# Patient Record
Sex: Female | Born: 1975 | Race: Black or African American | Hispanic: No | Smoking: Former smoker
Health system: Southern US, Community
[De-identification: ages and names within clinical notes are randomized; demographics above are authoritative.]

## PROBLEM LIST (undated history)

## (undated) DIAGNOSIS — B019 Varicella without complication: Secondary | ICD-10-CM

## (undated) DIAGNOSIS — T7840XA Allergy, unspecified, initial encounter: Secondary | ICD-10-CM

## (undated) DIAGNOSIS — D689 Coagulation defect, unspecified: Secondary | ICD-10-CM

## (undated) DIAGNOSIS — Z789 Other specified health status: Secondary | ICD-10-CM

## (undated) DIAGNOSIS — K649 Unspecified hemorrhoids: Secondary | ICD-10-CM

## (undated) DIAGNOSIS — K635 Polyp of colon: Secondary | ICD-10-CM

## (undated) HISTORY — DX: Coagulation defect, unspecified: D68.9

## (undated) HISTORY — DX: Unspecified hemorrhoids: K64.9

## (undated) HISTORY — DX: Allergy, unspecified, initial encounter: T78.40XA

## (undated) HISTORY — PX: POLYPECTOMY: SHX149

## (undated) HISTORY — DX: Varicella without complication: B01.9

## (undated) HISTORY — PX: COLONOSCOPY: SHX174

## (undated) HISTORY — DX: Polyp of colon: K63.5

---
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
## 1997-11-27 ENCOUNTER — Emergency Department (HOSPITAL_COMMUNITY): Admission: EM | Admit: 1997-11-27 | Discharge: 1997-11-27 | Payer: Self-pay | Admitting: Emergency Medicine

## 1998-07-23 ENCOUNTER — Inpatient Hospital Stay (HOSPITAL_COMMUNITY): Admission: AD | Admit: 1998-07-23 | Discharge: 1998-07-23 | Payer: Self-pay | Admitting: Obstetrics and Gynecology

## 1998-09-11 ENCOUNTER — Inpatient Hospital Stay (HOSPITAL_COMMUNITY): Admission: AD | Admit: 1998-09-11 | Discharge: 1998-09-13 | Payer: Self-pay | Admitting: Obstetrics and Gynecology

## 1999-03-15 ENCOUNTER — Emergency Department (HOSPITAL_COMMUNITY): Admission: EM | Admit: 1999-03-15 | Discharge: 1999-03-15 | Payer: Self-pay | Admitting: Emergency Medicine

## 1999-03-15 ENCOUNTER — Encounter: Payer: Self-pay | Admitting: Emergency Medicine

## 1999-06-29 ENCOUNTER — Ambulatory Visit (HOSPITAL_COMMUNITY): Admission: RE | Admit: 1999-06-29 | Discharge: 1999-06-29 | Payer: Self-pay | Admitting: *Deleted

## 1999-06-29 ENCOUNTER — Encounter: Payer: Self-pay | Admitting: *Deleted

## 1999-09-20 ENCOUNTER — Inpatient Hospital Stay (HOSPITAL_COMMUNITY): Admission: AD | Admit: 1999-09-20 | Discharge: 1999-09-20 | Payer: Self-pay | Admitting: Obstetrics & Gynecology

## 1999-12-07 ENCOUNTER — Inpatient Hospital Stay (HOSPITAL_COMMUNITY): Admission: AD | Admit: 1999-12-07 | Discharge: 1999-12-09 | Payer: Self-pay | Admitting: Obstetrics and Gynecology

## 2000-04-14 ENCOUNTER — Other Ambulatory Visit: Admission: RE | Admit: 2000-04-14 | Discharge: 2000-04-14 | Payer: Self-pay | Admitting: Obstetrics and Gynecology

## 2001-10-19 ENCOUNTER — Other Ambulatory Visit: Admission: RE | Admit: 2001-10-19 | Discharge: 2001-10-19 | Payer: Self-pay | Admitting: Obstetrics and Gynecology

## 2005-02-06 ENCOUNTER — Other Ambulatory Visit: Admission: RE | Admit: 2005-02-06 | Discharge: 2005-02-06 | Payer: Self-pay | Admitting: Obstetrics and Gynecology

## 2009-04-12 ENCOUNTER — Encounter: Admission: RE | Admit: 2009-04-12 | Discharge: 2009-04-12 | Payer: Self-pay | Admitting: Obstetrics and Gynecology

## 2010-03-06 ENCOUNTER — Inpatient Hospital Stay (INDEPENDENT_AMBULATORY_CARE_PROVIDER_SITE_OTHER)
Admission: RE | Admit: 2010-03-06 | Discharge: 2010-03-06 | Disposition: A | Discharge status: Home / Self Care | Payer: 59 | Source: Ambulatory Visit | Attending: Emergency Medicine | Admitting: Emergency Medicine

## 2010-03-06 DIAGNOSIS — R002 Palpitations: Secondary | ICD-10-CM

## 2010-05-25 NOTE — H&P (Signed)
[facility]  Patient:    Tammy Boyd, Tammy Boyd                      MRN: [id number] Adm. Date:  [id number] Disc. Date: [id number] Attending:  Cleatrice Burke Dictator:   Mack Guise, C.N.M.                         History and Physical  HISTORY OF PRESENT ILLNESS:   Tammy Boyd is a 35 year old gravida 3, para 1-0-1-1 at 39-6/7ths weeks, EDD December 08, 1999, by early ultrasound, who presents with the spontaneous rupture of membranes at home at [facility] for clear fluid.  Contractions began prior to rupture of membranes and have become stronger since that time.  She reports positive fetal movement and no bleeding.  She denies any headache, visual changes or epigastric pain.  Her pregnancy has been followed by the M.D. service at [facility] and remarkable for: 1. A history of BV.  2. No last menstrual period, this is her second pregnancy in less than 12 months.  3. History of PIH.  4. Equivocal rubella. 5. Positive group B strep.  This patient was initially evaluated at the office of [facility] on June 22, 1999, at 16 weeks 6 days by pregnancy ultrasonography. Her pregnancy has been unremarkable.  Throughout the prenatal course the growth of the uterine fundus has been noted to be compatible in size with her dates.  Systolic blood pressure has ranged from 110 to 130, diastolic blood pressure has ranged from 60 to 80.  There has been no significant proteinuria.  PRENATAL LABORATORY WORK:     On June 22, 1999, hemoglobin 12.5, platelets 183,000.  Blood type and Rh A positive.  Antibody screen negative.  VDRL nonreactive.  Rubella equivocal.  Hepatitis B surface antigen negative.  HIV nonreactive.  On June 22, 1999, AFP/free beta-hCG within normal range.  On September 25, 1999, at 28 weeks one-hour glucose challenge 98 and hemoglobin 11.9.  The patient had a history of positive group B strep from her previous pregnancy.  OBSTETRICAL HISTORY:          In 1992 induced AB.   In September 2000, the patient had a normal spontaneous vaginal delivery with the birth of a female infant 6 pounds 3 ounces at term.  History of positive group B strep and PIH with that pregnancy and the present pregnancy.  MEDICAL HISTORY:              The patient has a history of abnormal Pap smear and colposcopy.  FAMILY HISTORY:               Father with a history of heart disease.  The patients mother and father with a history of chronic hypertension.  The patients mother and father with a history of phlebitis and circulatory problems.  Paternal grandmother and maternal grandmother, maternal aunt and a cousin with a history of diabetes.  The patients father with a history of colon CA.  The patients mother with a history of depression.  Maternal aunt with a history of thyroid disease.  Maternal great-grandmother Alzheimers disease.  Maternal aunt rheumatoid arthritis.  HABITS:                       The patient denies the use of alcohol, tobacco or illicit drugs.  GENETIC HISTORY:  There is no family history of genetic or chromosomal disorders, children that died in infancy or that were born with birth defects.  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  Her husband, Beverly Sessions, is involved and supportive. They are of the [facility] [employer] [facility] [employer] faith.  REVIEW OF SYSTEMS:            There are no signs or symptoms suggestive of focal or systemic disease, and the patient is typical of one with a uterine pregnancy at term with premature rupture of membranes in early labor.  OBJECTIVE DATA:  VITAL SIGNS:                  Stable, afebrile.  HEENT:                        Unremarkable.  HEART:                        Regular rate and rhythm.  LUNGS:                        Clear.  ABDOMEN:                      Soft and nontender, gravid in its contour. Uterine fundus is noted to extend 40 cm above the level of the  pubic symphysis.  Leopolds maneuver finds the infant to be in a longitudinal lie, cephalic presentation and the estimated fetal weight is 7.5 pounds.  PELVIC:                       Digital exam of the cervix finds it to be 3-4 cm dilated, 80% effaced with the cephalic presenting part at a -1 station. Membranes are grossly ruptured and clear.  ASSESSMENT:                   1. Intrauterine pregnancy at term with premature                                  rupture of membranes.                               2. In early labor.  PLAN:                         Admit to birthing suite per Dr. Dierdre Forth. Routine M.D. orders.  Start group B strep prophylaxis with penicillin G. DD:  12/07/99 TD:  12/07/99 Job: [id number]

## 2011-04-06 IMAGING — MG MM DIGITAL SCREENING BILAT W/ CAD
4 series · 4 of 4 positions shown · non-contrast
Comparison: none

DG SCREEN MAMMOGRAM BILATERAL
Bilateral CC and MLO view(s) were taken.

DIGITAL SCREENING MAMMOGRAM WITH CAD:
The breast tissue is extremely dense.  No masses or malignant type calcifications are identified.
Images were processed with CAD.

[R CC]
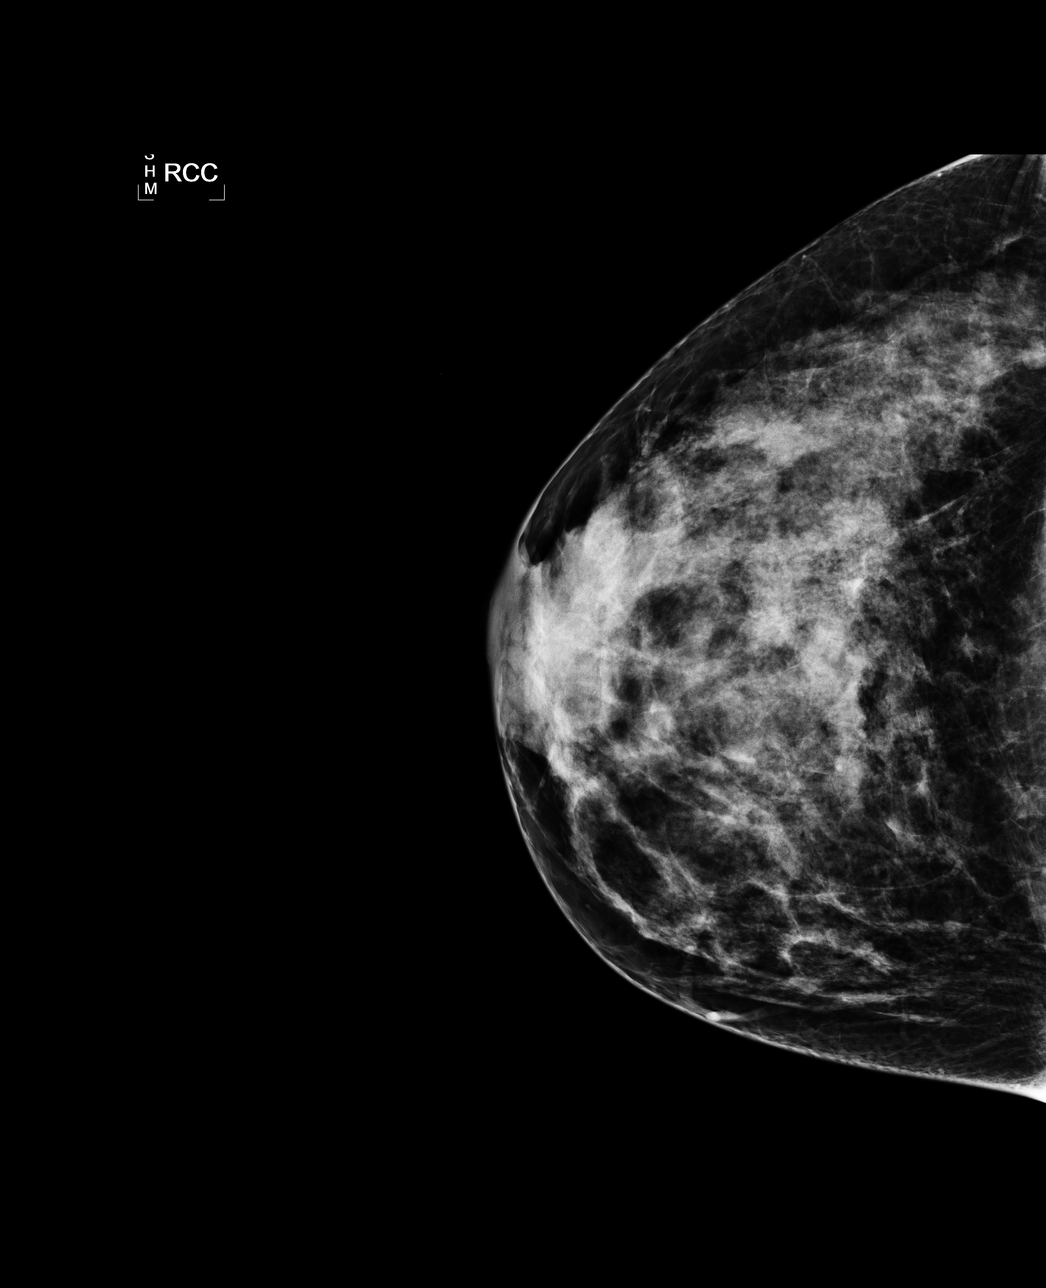

[L CC]
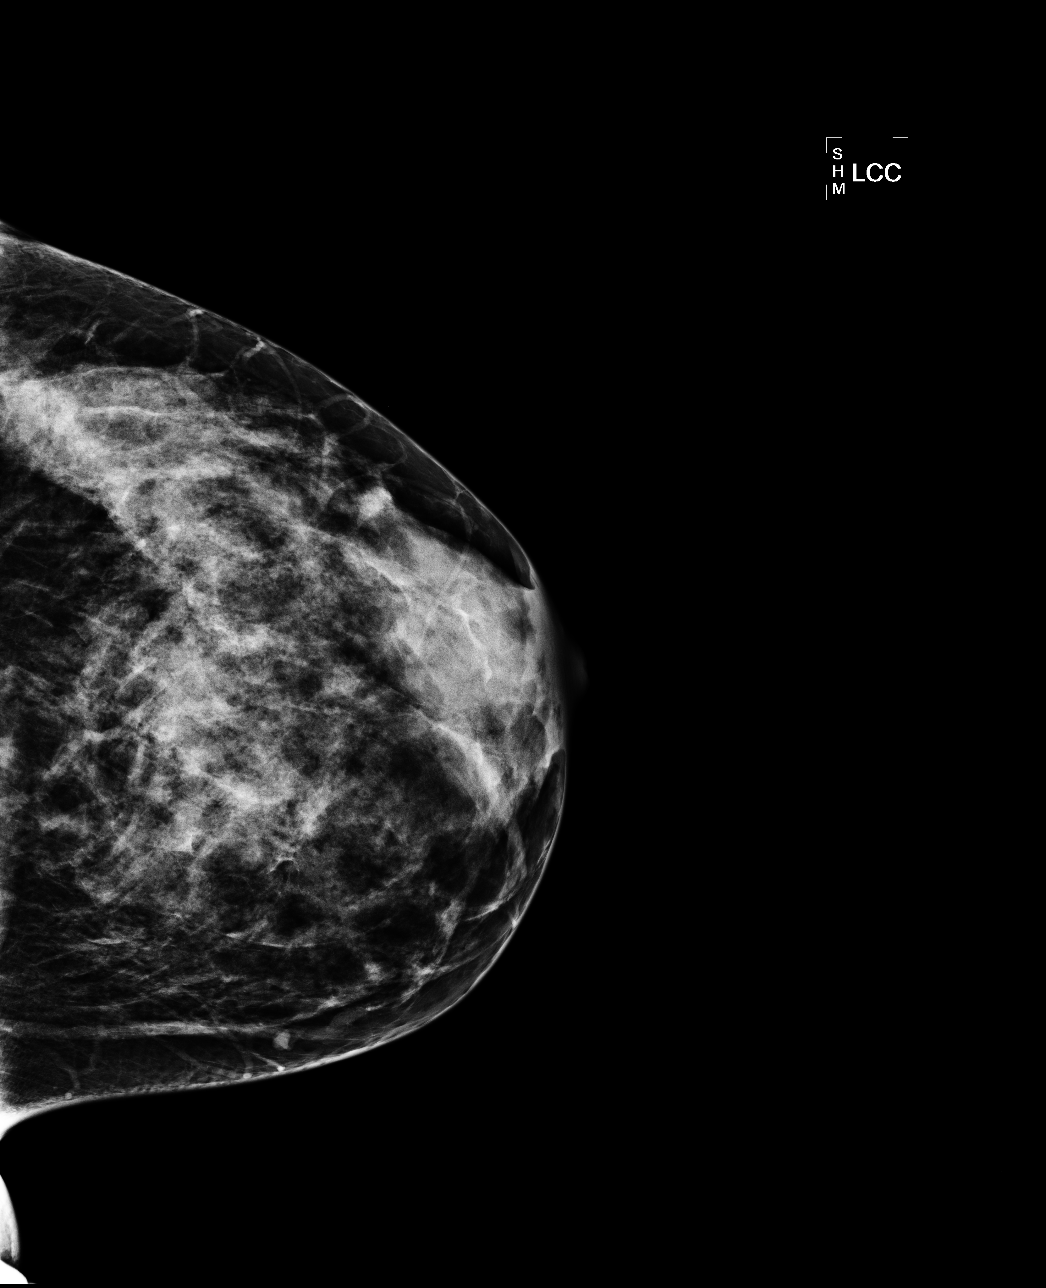

[L MLO]
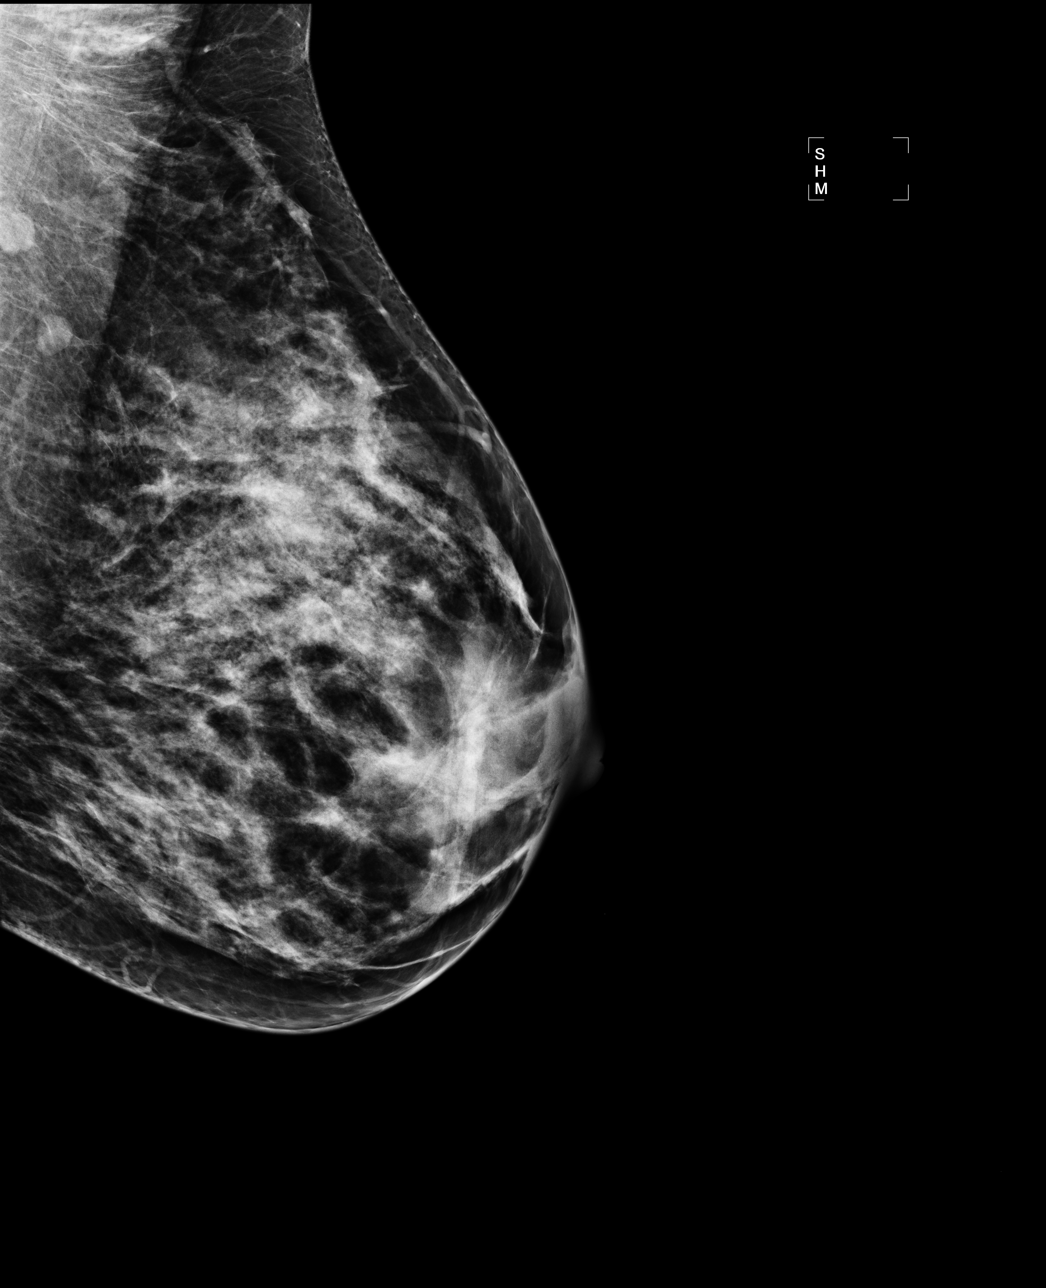

[R MLO]
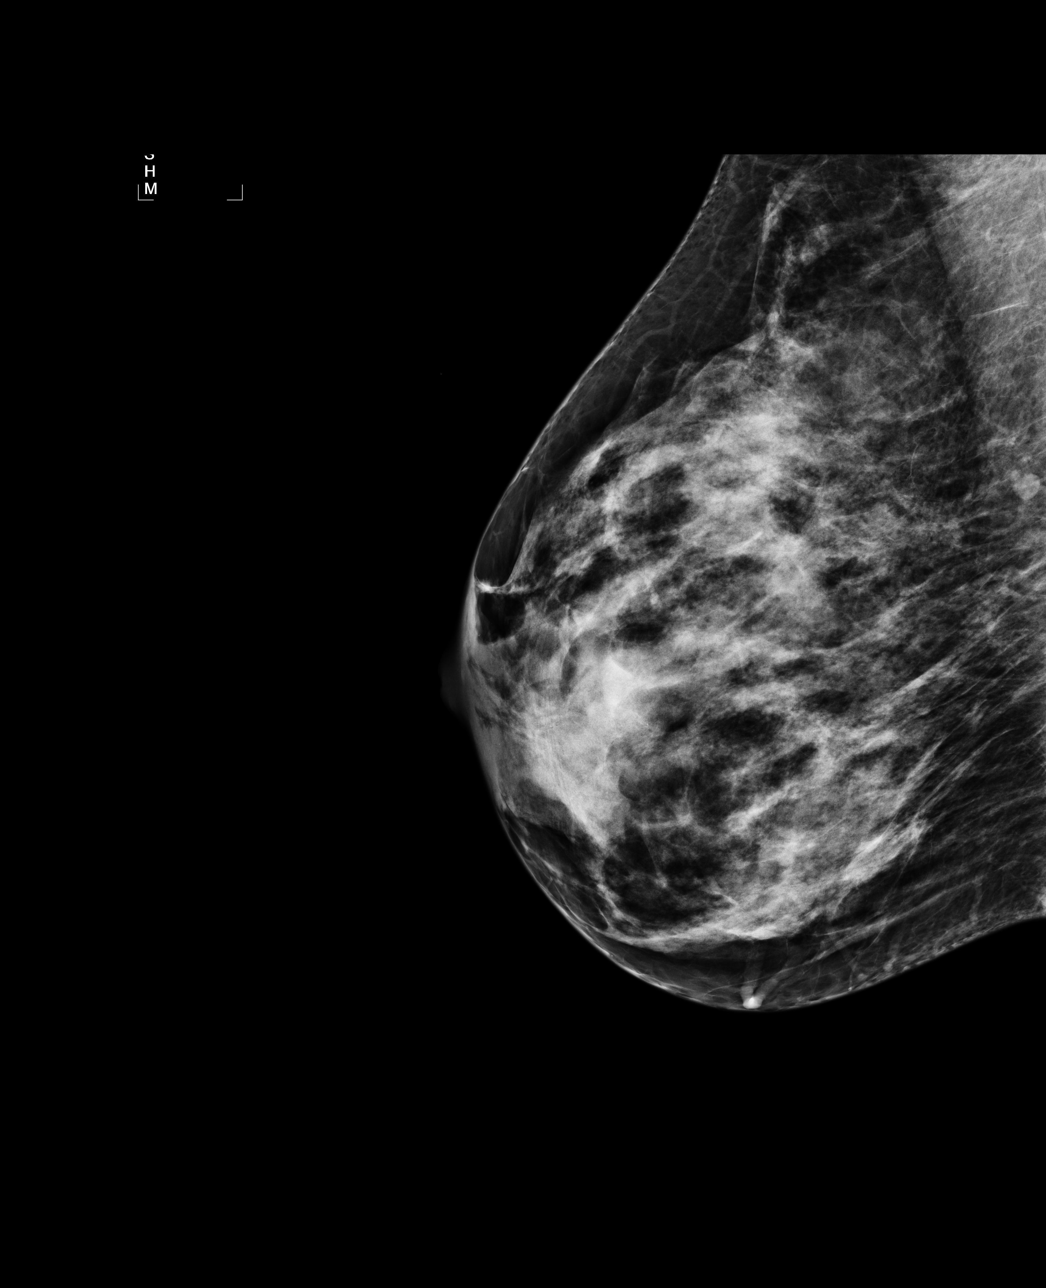

[4 of 4 positions shown; findings below may reference images not displayed]

IMPRESSION: No specific mammographic evidence of malignancy.  Next screening mammogram is recommended at age 
39.

A result letter of this screening mammogram will be mailed directly to the patient.

ASSESSMENT: Negative - BI-RADS 1

Screening mammogram at age 39.
,

## 2011-11-13 ENCOUNTER — Encounter: Payer: Self-pay | Admitting: Internal Medicine

## 2011-11-29 ENCOUNTER — Ambulatory Visit (INDEPENDENT_AMBULATORY_CARE_PROVIDER_SITE_OTHER): Payer: 59 | Admitting: Internal Medicine

## 2011-11-29 ENCOUNTER — Encounter: Payer: Self-pay | Admitting: Internal Medicine

## 2011-11-29 VITALS — BP 110/72 | HR 89 | Ht 68.0 in | Wt 194.2 lb

## 2011-11-29 DIAGNOSIS — Z800 Family history of malignant neoplasm of digestive organs: Secondary | ICD-10-CM

## 2011-11-29 DIAGNOSIS — R198 Other specified symptoms and signs involving the digestive system and abdomen: Secondary | ICD-10-CM

## 2011-11-29 DIAGNOSIS — Z1211 Encounter for screening for malignant neoplasm of colon: Secondary | ICD-10-CM

## 2011-11-29 DIAGNOSIS — K625 Hemorrhage of anus and rectum: Secondary | ICD-10-CM

## 2011-11-29 MED ORDER — MOVIPREP 100 G PO SOLR: 1.0000 | Freq: Once | ORAL | Status: DC

## 2011-11-29 NOTE — Progress Notes (Signed)
HISTORY OF PRESENT ILLNESS:  Tammy Boyd is a 36 y.o. female with no significant past medical history who presents today regarding minor rectal bleeding, rectal discomfort, change in bowel habits, and the need for screening colonoscopy. The patient's father was diagnosed with colon cancer at age 65. She also has a maternal great uncle and great aunt with colon cancer. The patient herself was well until 3-4 months ago when she began to notice minor red blood on the tissue paper and occasionally in the toilet bowl. Associated with this was some rectal discomfort. Burning or stinging. This has improved over the past 3 weeks. She denies abdominal pain or weight loss. Her bowels have been somewhat less firm. Otherwise no complaints.  REVIEW OF SYSTEMS:  All non-GI ROS negative except for back pain  History reviewed. No pertinent past medical history.  Past Surgical History  Procedure Date  . Induced abortion     Social History Tammy Boyd  reports that she has been smoking Cigarettes.  She has never used smokeless tobacco. She reports that she drinks alcohol. She reports that she does not use illicit drugs.  family history includes Breast cancer in her maternal grandmother; Colon cancer in her father; Colon polyps in her mother; Diabetes in her mother; and Ulcerative colitis in her mother.  No Known Allergies     PHYSICAL EXAMINATION: Vital signs: BP 110/72  Pulse 89  Ht 5\' 8"  (1.727 m)  Wt 194 lb 3.2 oz (88.089 kg)  BMI 29.53 kg/m2  SpO2 99%  LMP 11/14/2011  Constitutional: generally well-appearing, no acute distress Psychiatric: alert and oriented x3, cooperative Eyes: extraocular movements intact, anicteric, conjunctiva pink Mouth: oral pharynx moist, no lesions Neck: supple no lymphadenopathy Cardiovascular: heart regular rate and rhythm, no murmur Lungs: clear to auscultation bilaterally Abdomen: soft, nontender, nondistended, no obvious ascites, no peritoneal  signs, normal bowel sounds, no organomegaly Rectal:deferred until colonoscopy Extremities: no lower extremity edema bilaterally Skin: no lesions on visible extremities Neuro: No focal deficits.   ASSESSMENT:  #1. Family history of colon cancer in her father at age 40. The patient is 1, and is appropriate for her to have her initial screening colonoscopy at this time. #2. Minor intermittent rectal bleeding with rectal discomfort and change in bowel habits as described. Suspect she had a rectal fissure, which has healed  PLAN:  #1. Screening colonoscopy.The nature of the procedure, as well as the risks, benefits, and alternatives were carefully and thoroughly reviewed with the patient. Ample time for discussion and questions allowed. The patient understood, was satisfied, and agreed to proceed. Movi prep prescribed. The patient instructed on its use #2. Daily fiber supplementation with Metamucil

## 2011-11-29 NOTE — Patient Instructions (Addendum)
You have been scheduled for a colonoscopy with propofol. Please follow written instructions given to you at your visit today.  Please pick up your prep kit at the pharmacy within the next 1-3 days. If you use inhalers (even only as needed) or a CPAP machine, please bring them with you on the day of your procedure.  

## 2011-12-02 ENCOUNTER — Encounter: Payer: Self-pay | Admitting: Internal Medicine

## 2011-12-11 ENCOUNTER — Encounter: Payer: 59 | Admitting: Internal Medicine

## 2012-01-16 ENCOUNTER — Ambulatory Visit (AMBULATORY_SURGERY_CENTER): Payer: 59 | Admitting: Internal Medicine

## 2012-01-16 ENCOUNTER — Encounter: Payer: Self-pay | Admitting: Internal Medicine

## 2012-01-16 VITALS — BP 153/92 | HR 60 | Temp 97.6°F | Resp 15 | Ht 68.0 in | Wt 194.0 lb

## 2012-01-16 DIAGNOSIS — D126 Benign neoplasm of colon, unspecified: Secondary | ICD-10-CM

## 2012-01-16 DIAGNOSIS — K625 Hemorrhage of anus and rectum: Secondary | ICD-10-CM

## 2012-01-16 DIAGNOSIS — R198 Other specified symptoms and signs involving the digestive system and abdomen: Secondary | ICD-10-CM

## 2012-01-16 DIAGNOSIS — Z1211 Encounter for screening for malignant neoplasm of colon: Secondary | ICD-10-CM

## 2012-01-16 DIAGNOSIS — Z800 Family history of malignant neoplasm of digestive organs: Secondary | ICD-10-CM

## 2012-01-16 MED ORDER — SODIUM CHLORIDE 0.9 % IV SOLN: 500.0000 mL | INTRAVENOUS | Status: DC

## 2012-01-16 NOTE — Patient Instructions (Addendum)
YOU HAD AN ENDOSCOPIC PROCEDURE TODAY AT THE [facility]: Refer to the procedure report that was given to you for any specific questions about what was found during the examination.  If the procedure report does not answer your questions, please call your gastroenterologist to clarify.  If you requested that your care partner not be given the details of your procedure findings, then the procedure report has been included in a sealed envelope for you to review at your convenience later.  YOU SHOULD EXPECT: Some feelings of bloating in the abdomen. Passage of more gas than usual.  Walking can help get rid of the air that was put into your GI tract during the procedure and reduce the bloating. If you had a lower endoscopy (such as a colonoscopy or flexible sigmoidoscopy) you may notice spotting of blood in your stool or on the toilet paper. If you underwent a bowel prep for your procedure, then you may not have a normal bowel movement for a few days.  DIET: Your first meal following the procedure should be a light meal and then it is ok to progress to your normal diet.  A half-sandwich or bowl of soup is an example of a good first meal.  Heavy or fried foods are harder to digest and may make you feel nauseous or bloated.  Likewise meals heavy in dairy and vegetables can cause extra gas to form and this can also increase the bloating.  Drink plenty of fluids but you should avoid alcoholic beverages for 24 hours.  ACTIVITY: Your care partner should take you home directly after the procedure.  You should plan to take it easy, moving slowly for the rest of the day.  You can resume normal activity the day after the procedure however you should NOT DRIVE or use heavy machinery for 24 hours (because of the sedation medicines used during the test).    SYMPTOMS TO REPORT IMMEDIATELY: A gastroenterologist can be reached at any hour.  During normal business hours, 8:30 AM to 5:00 PM Monday through Friday,  call [phone].  After hours and on weekends, please call the GI answering service at [phone] who will take a message and have the physician on call contact you.   Following lower endoscopy (colonoscopy or flexible sigmoidoscopy):  Excessive amounts of blood in the stool  Significant tenderness or worsening of abdominal pains  Swelling of the abdomen that is new, acute  Fever of 100F or higher  FOLLOW UP: If any biopsies were taken you will be contacted by phone or by letter within the next 1-3 weeks.  Call your gastroenterologist if you have not heard about the biopsies in 3 weeks.  Our staff will call the home number listed on your records the next business day following your procedure to check on you and address any questions or concerns that you may have at that time regarding the information given to you following your procedure. This is a courtesy call and so if there is no answer at the home number and we have not heard from you through the emergency physician on call, we will assume that you have returned to your regular daily activities without incident.  SIGNATURES/CONFIDENTIALITY: You and/or your care partner have signed paperwork which will be entered into your electronic medical record.  These signatures attest to the fact that that the information above on your After Visit Summary has been reviewed and is understood.  Full responsibility of the confidentiality of this   discharge information lies with you and/or your care-partner.    Resume medications. Information given on polyps with discharge instructions. 

## 2012-01-16 NOTE — Op Note (Signed)
[facility] [address] [place], [address]   COLONOSCOPY PROCEDURE REPORT  PATIENT: Tammy Boyd, Tammy Boyd  MR#: [id number] BIRTHDATE: January 29, 1975 , 36  yrs. old GENDER: Female ENDOSCOPIST: Roxy Cedar, MD REFERRED XB:JYNWG Richardson, M.D. PROCEDURE DATE:  01/16/2012 PROCEDURE:   Colonoscopy with snare polypectomy    x 1 ASA CLASS:   Class I INDICATIONS:elevated risk screening.   Father with CRC at 86 MEDICATIONS: MAC sedation, administered by CRNA and propofol (Diprivan) 300mg  IV  DESCRIPTION OF PROCEDURE:   After the risks benefits and alternatives of the procedure were thoroughly explained, informed consent was obtained.  A digital rectal exam revealed no abnormalities of the rectum.   The LB CF-[id number]  endoscope was introduced through the anus and advanced to the cecum, which was identified by both the appendix and ileocecal valve. No adverse events experienced.   The quality of the prep was excellent, using MoviPrep  The instrument was then slowly withdrawn as the colon was fully examined.      COLON FINDINGS: A diminutive polyp was found in the ascending colon. A polypectomy was performed with a cold snare.  The resection was complete and the polyp tissue was completely retrieved.   The colon mucosa was otherwise normal.  Retroflexed views revealed internal hemorrhoids. The time to cecum=2 minutes 07 seconds.  Withdrawal time=11 minutes 07 seconds.  The scope was withdrawn and the procedure completed. COMPLICATIONS: There were no complications.  ENDOSCOPIC IMPRESSION: 1.   Diminutive polyp was found in the ascending colon; polypectomy was performed with a cold snare 2.   The colon mucosa was otherwise normal  RECOMMENDATIONS: 1. Follow up colonoscopy in 5 years   eSigned:  Roxy Cedar, MD 01/16/2012 1:17 PM   cc: Huel Cote, MD and The Patient

## 2012-01-16 NOTE — Progress Notes (Signed)
Patient did not experience any of the following events: a burn prior to discharge; a fall within the facility; wrong site/side/patient/procedure/implant event; or a hospital transfer or hospital admission upon discharge from the facility. (G8907) Patient did not have preoperative order for IV antibiotic SSI prophylaxis. (G8918)  

## 2012-01-16 NOTE — Progress Notes (Signed)
1320 a/ox3 pleased with MAC report to [employer]

## 2012-01-16 NOTE — Progress Notes (Signed)
Called to room to assist during endoscopic procedure.  Patient ID and intended procedure confirmed with present staff. Received instructions for my participation in the procedure from the performing physician.  

## 2012-01-17 ENCOUNTER — Telehealth: Payer: Self-pay | Admitting: *Deleted

## 2012-01-17 NOTE — Telephone Encounter (Signed)
  Follow up Call-  Call back number 01/16/2012  Post procedure Call Back phone  # [phone]  Permission to leave phone message Yes     Patient questions:  Do you have a fever, pain , or abdominal swelling? no Pain Score  0 *  Have you tolerated food without any problems? yes  Have you been able to return to your normal activities? yes  Do you have any questions about your discharge instructions: Diet   no Medications  no Follow up visit  no  Do you have questions or concerns about your Care? no  Actions: * If pain score is 4 or above: No action needed, pain <4.

## 2012-01-21 ENCOUNTER — Encounter: Payer: Self-pay | Admitting: Internal Medicine

## 2013-11-04 ENCOUNTER — Encounter (HOSPITAL_COMMUNITY): Payer: Self-pay | Admitting: Pharmacist

## 2013-11-09 ENCOUNTER — Encounter (HOSPITAL_COMMUNITY)
Admission: RE | Admit: 2013-11-09 | Discharge: 2013-11-09 | Disposition: A | Discharge status: Home / Self Care | Payer: 59 | Source: Ambulatory Visit | Attending: Obstetrics and Gynecology | Admitting: Obstetrics and Gynecology

## 2013-11-09 ENCOUNTER — Encounter (HOSPITAL_COMMUNITY): Payer: Self-pay

## 2013-11-09 DIAGNOSIS — Z01812 Encounter for preprocedural laboratory examination: Secondary | ICD-10-CM | POA: Insufficient documentation

## 2013-11-09 HISTORY — DX: Other specified health status: Z78.9

## 2013-11-09 LAB — CBC
HCT: 37.1 % (ref 36.0–46.0)
HEMOGLOBIN: 12.5 g/dL (ref 12.0–15.0)
MCH: 30.2 pg (ref 26.0–34.0)
MCHC: 33.7 g/dL (ref 30.0–36.0)
MCV: 89.6 fL (ref 78.0–100.0)
Platelets: 234 10*3/uL (ref 150–400)
RBC: 4.14 MIL/uL (ref 3.87–5.11)
RDW: 13.8 % (ref 11.5–15.5)
WBC: 6.6 10*3/uL (ref 4.0–10.5)

## 2013-11-09 NOTE — Patient Instructions (Signed)
Your procedure is scheduled on: 11/15/13  Enter through the Main Entrance at : 6am Pick up desk phone and dial [phone] and inform us of your arrival.  Please call [phone] if you have any problems the morning of surgery.  Remember: Do not eat food or drink liquids, including water, after midnight:Sunday   You may brush your teeth the morning of surgery.   DO NOT wear jewelry, eye make-up, lipstick,body lotion, or dark fingernail polish.  (Polished toes are ok) You may wear deodorant.  If you are to be admitted after surgery, leave suitcase in car until your room has been assigned. Patients discharged on the day of surgery will not be allowed to drive home. Wear loose fitting, comfortable clothes for your ride home.

## 2013-11-14 NOTE — H&P (Signed)
Tammy Boyd is an 38 y.o. female G2P2 who presents for scheduled laparoscopic BTL.  She had an attempted Essure x 2 in the office with successful placement of her left coil, but the right coil would never feed into the tube.  After two attempts, we felt the best option was to proceed to laparoscopic BTL.  She has been on Depo-provera for contraception.  Pertinent Gynecological History: OB History:  NSVD x 2   Menstrual History:  No LMP recorded.    Past Medical History  Diagnosis Date  . Medical history non-contributory     Past Surgical History  Procedure Laterality Date  . Induced abortion      Family History  Problem Relation Age of Onset  . Breast cancer Maternal Grandmother     maternal great aunt  . Colon cancer Father     in remission  . Colon polyps Mother   . Ulcerative colitis Mother   . Diabetes Mother     father, maternal aunts    Social History:  reports that she quit smoking about 1 years ago. Her smoking use included Cigarettes. She smoked 0.00 packs per day. She has never used smokeless tobacco. She reports that she drinks alcohol. She reports that she does not use illicit drugs.  Allergies: No Known Allergies  No prescriptions prior to admission    ROS  There were no vitals taken for this visit. Physical Exam  Constitutional: She is oriented to person, place, and time. She appears well-nourished.  Cardiovascular: Normal rate and regular rhythm.   Respiratory: Effort normal.  GI: Soft.  Genitourinary: Vagina normal and uterus normal.  Neurological: She is alert and oriented to person, place, and time.  Psychiatric: She has a normal mood and affect.    No results found for this or any previous visit (from the past 24 hour(s)).  No results found.  Assessment/Plan: Pt was counseled on the risks and benefits of laparoscopic tubal ligation in detail. We discussed bleeding, infection and possible damage to bowel and bladder with trocar  placement.  We discussed  A risk of failure of 1/100 and increased risk of ectopic if pregnancy occurs.  Pt desires to proceed.  Logan Bores 11/14/2013, 10:21 AM

## 2013-11-14 NOTE — Anesthesia Preprocedure Evaluation (Addendum)
Anesthesia Evaluation  Patient identified by MRN, date of birth, ID band Patient awake    Reviewed: Allergy & Precautions, H&P , NPO status , Patient's Chart, lab work & pertinent test results  Airway Mallampati: II  TM Distance: >3 FB Neck ROM: Full    Dental no notable dental hx. (+) Teeth Intact   Pulmonary neg pulmonary ROS, former smoker,  breath sounds clear to auscultation  Pulmonary exam normal       Cardiovascular negative cardio ROS  Rhythm:Regular Rate:Normal     Neuro/Psych negative neurological ROS  negative psych ROS   GI/Hepatic negative GI ROS, Neg liver ROS,   Endo/Other  Obesity  Renal/GU negative Renal ROS  negative genitourinary   Musculoskeletal negative musculoskeletal ROS (+)   Abdominal (+) + obese,   Peds  Hematology negative hematology ROS (+)   Anesthesia Other Findings   Reproductive/Obstetrics Desires Sterilization                            Anesthesia Physical Anesthesia Plan  ASA: II  Anesthesia Plan: General   Post-op Pain Management:    Induction: Intravenous  Airway Management Planned: Oral ETT  Additional Equipment:   Intra-op Plan:   Post-operative Plan: Extubation in OR  Informed Consent: I have reviewed the patients History and Physical, chart, labs and discussed the procedure including the risks, benefits and alternatives for the proposed anesthesia with the patient or authorized representative who has indicated his/her understanding and acceptance.   Dental advisory given  Plan Discussed with: Anesthesiologist, CRNA and Surgeon  Anesthesia Plan Comments:         Anesthesia Quick Evaluation

## 2013-11-15 ENCOUNTER — Ambulatory Visit (HOSPITAL_COMMUNITY)
Admission: RE | Admit: 2013-11-15 | Discharge: 2013-11-15 | Disposition: A | Discharge status: Home / Self Care | Payer: 59 | Source: Ambulatory Visit | Attending: Obstetrics and Gynecology | Admitting: Obstetrics and Gynecology

## 2013-11-15 ENCOUNTER — Encounter (HOSPITAL_COMMUNITY): Payer: Self-pay | Admitting: Anesthesiology

## 2013-11-15 ENCOUNTER — Ambulatory Visit (HOSPITAL_COMMUNITY): Payer: 59 | Admitting: Anesthesiology

## 2013-11-15 ENCOUNTER — Encounter (HOSPITAL_COMMUNITY)
Admission: RE | Disposition: A | Discharge status: Home / Self Care | Payer: Self-pay | Source: Ambulatory Visit | Attending: Obstetrics and Gynecology

## 2013-11-15 DIAGNOSIS — Z6834 Body mass index (BMI) 34.0-34.9, adult: Secondary | ICD-10-CM | POA: Diagnosis not present

## 2013-11-15 DIAGNOSIS — E669 Obesity, unspecified: Secondary | ICD-10-CM | POA: Insufficient documentation

## 2013-11-15 DIAGNOSIS — Z302 Encounter for sterilization: Secondary | ICD-10-CM | POA: Diagnosis present

## 2013-11-15 DIAGNOSIS — Z87891 Personal history of nicotine dependence: Secondary | ICD-10-CM | POA: Insufficient documentation

## 2013-11-15 HISTORY — PX: LAPAROSCOPIC TUBAL LIGATION: SHX1937

## 2013-11-15 LAB — PREGNANCY, URINE: Preg Test, Ur: NEGATIVE

## 2013-11-15 SURGERY — LIGATION, FALLOPIAN TUBE, LAPAROSCOPIC
Anesthesia: General | Laterality: Bilateral

## 2013-11-15 MED ORDER — MORPHINE SULFATE 4 MG/ML IJ SOLN: 1.0000 mg | INTRAMUSCULAR | Status: DC | PRN

## 2013-11-15 MED ORDER — 0.9 % SODIUM CHLORIDE (POUR BTL) OPTIME
TOPICAL | Status: DC | PRN
  Administered 2013-11-15: 1000 mL

## 2013-11-15 MED ORDER — OXYCODONE-ACETAMINOPHEN 5-325 MG PO TABS: 1.0000 | ORAL_TABLET | ORAL | Status: DC | PRN

## 2013-11-15 MED ORDER — NEOSTIGMINE METHYLSULFATE 10 MG/10ML IV SOLN
INTRAVENOUS | Status: AC
  Filled 2013-11-15: qty 1

## 2013-11-15 MED ORDER — METOCLOPRAMIDE HCL 5 MG/ML IJ SOLN: 10.0000 mg | Freq: Once | INTRAMUSCULAR | Status: DC | PRN

## 2013-11-15 MED ORDER — LIDOCAINE HCL (CARDIAC) 20 MG/ML IV SOLN
INTRAVENOUS | Status: AC
  Filled 2013-11-15: qty 5

## 2013-11-15 MED ORDER — ROCURONIUM BROMIDE 100 MG/10ML IV SOLN
INTRAVENOUS | Status: AC
  Filled 2013-11-15: qty 1

## 2013-11-15 MED ORDER — FENTANYL CITRATE 0.05 MG/ML IJ SOLN
INTRAMUSCULAR | Status: AC
  Filled 2013-11-15: qty 5

## 2013-11-15 MED ORDER — MIDAZOLAM HCL 2 MG/2ML IJ SOLN
INTRAMUSCULAR | Status: AC
  Filled 2013-11-15: qty 2

## 2013-11-15 MED ORDER — NEOSTIGMINE METHYLSULFATE 10 MG/10ML IV SOLN
INTRAVENOUS | Status: DC | PRN
  Administered 2013-11-15: 3 mg via INTRAVENOUS

## 2013-11-15 MED ORDER — FENTANYL CITRATE 0.05 MG/ML IJ SOLN
INTRAMUSCULAR | Status: DC | PRN
  Administered 2013-11-15 (×5): 50 ug via INTRAVENOUS

## 2013-11-15 MED ORDER — ONDANSETRON HCL 4 MG/2ML IJ SOLN
INTRAMUSCULAR | Status: DC | PRN
  Administered 2013-11-15: 4 mg via INTRAVENOUS

## 2013-11-15 MED ORDER — ONDANSETRON HCL 4 MG/2ML IJ SOLN
INTRAMUSCULAR | Status: AC
  Filled 2013-11-15: qty 2

## 2013-11-15 MED ORDER — PROPOFOL 10 MG/ML IV BOLUS
INTRAVENOUS | Status: DC | PRN
  Administered 2013-11-15: 200 mg via INTRAVENOUS

## 2013-11-15 MED ORDER — MIDAZOLAM HCL 2 MG/2ML IJ SOLN
INTRAMUSCULAR | Status: DC | PRN
  Administered 2013-11-15: 2 mg via INTRAVENOUS

## 2013-11-15 MED ORDER — BUPIVACAINE HCL (PF) 0.25 % IJ SOLN
INTRAMUSCULAR | Status: AC
  Filled 2013-11-15: qty 30

## 2013-11-15 MED ORDER — SCOPOLAMINE 1 MG/3DAYS TD PT72
1.0000 | MEDICATED_PATCH | Freq: Once | TRANSDERMAL | Status: DC
  Administered 2013-11-15: 1.5 mg via TRANSDERMAL

## 2013-11-15 MED ORDER — GLYCOPYRROLATE 0.2 MG/ML IJ SOLN
INTRAMUSCULAR | Status: AC
  Filled 2013-11-15: qty 3

## 2013-11-15 MED ORDER — GLYCOPYRROLATE 0.2 MG/ML IJ SOLN
INTRAMUSCULAR | Status: DC | PRN
  Administered 2013-11-15: 0.6 mg via INTRAVENOUS

## 2013-11-15 MED ORDER — BUPIVACAINE HCL (PF) 0.25 % IJ SOLN
INTRAMUSCULAR | Status: DC | PRN
  Administered 2013-11-15: 10 mL

## 2013-11-15 MED ORDER — KETOROLAC TROMETHAMINE 30 MG/ML IJ SOLN
INTRAMUSCULAR | Status: AC
  Filled 2013-11-15: qty 1

## 2013-11-15 MED ORDER — DEXAMETHASONE SODIUM PHOSPHATE 10 MG/ML IJ SOLN
INTRAMUSCULAR | Status: AC
  Filled 2013-11-15: qty 1

## 2013-11-15 MED ORDER — DEXAMETHASONE SODIUM PHOSPHATE 10 MG/ML IJ SOLN
INTRAMUSCULAR | Status: DC | PRN
  Administered 2013-11-15: 10 mg via INTRAVENOUS

## 2013-11-15 MED ORDER — LACTATED RINGERS IV SOLN
INTRAVENOUS | Status: DC
  Administered 2013-11-15 (×2): via INTRAVENOUS

## 2013-11-15 MED ORDER — SCOPOLAMINE 1 MG/3DAYS TD PT72: 1.0000 | MEDICATED_PATCH | TRANSDERMAL | Status: DC

## 2013-11-15 MED ORDER — KETOROLAC TROMETHAMINE 30 MG/ML IJ SOLN
INTRAMUSCULAR | Status: DC | PRN
  Administered 2013-11-15: 30 mg via INTRAVENOUS

## 2013-11-15 MED ORDER — MEPERIDINE HCL 25 MG/ML IJ SOLN: 6.2500 mg | INTRAMUSCULAR | Status: DC | PRN

## 2013-11-15 MED ORDER — PROPOFOL 10 MG/ML IV EMUL
INTRAVENOUS | Status: AC
  Filled 2013-11-15: qty 20

## 2013-11-15 MED ORDER — IBUPROFEN 200 MG PO TABS: 600.0000 mg | ORAL_TABLET | Freq: Four times a day (QID) | ORAL | Status: DC | PRN

## 2013-11-15 MED ORDER — LACTATED RINGERS IV SOLN
INTRAVENOUS | Status: DC
  Administered 2013-11-15: 06:00:00 via INTRAVENOUS

## 2013-11-15 MED ORDER — ROCURONIUM BROMIDE 100 MG/10ML IV SOLN
INTRAVENOUS | Status: DC | PRN
  Administered 2013-11-15: 30 mg via INTRAVENOUS

## 2013-11-15 MED ORDER — LIDOCAINE HCL (CARDIAC) 20 MG/ML IV SOLN
INTRAVENOUS | Status: DC | PRN
  Administered 2013-11-15: 80 mg via INTRAVENOUS

## 2013-11-15 SURGICAL SUPPLY — 20 items
CATH ROBINSON RED A/P 16FR (CATHETERS) ×3 IMPLANT
CHLORAPREP W/TINT 26ML (MISCELLANEOUS) ×3 IMPLANT
CLOTH BEACON ORANGE TIMEOUT ST (SAFETY) ×3 IMPLANT
DRSG COVADERM PLUS 2X2 (GAUZE/BANDAGES/DRESSINGS) ×6 IMPLANT
DRSG OPSITE POSTOP 3X4 (GAUZE/BANDAGES/DRESSINGS) ×2 IMPLANT
GLOVE BIO SURGEON STRL SZ 6.5 (GLOVE) ×2 IMPLANT
GLOVE BIO SURGEONS STRL SZ 6.5 (GLOVE) ×1
GOWN STRL REUS W/TWL LRG LVL3 (GOWN DISPOSABLE) ×6 IMPLANT
LIQUID BAND (GAUZE/BANDAGES/DRESSINGS) ×3 IMPLANT
NEEDLE INSUFFLATION 120MM (ENDOMECHANICALS) ×3 IMPLANT
PACK LAPAROSCOPY BASIN (CUSTOM PROCEDURE TRAY) ×3 IMPLANT
PAD TRENDELENBURG OR TABLE (MISCELLANEOUS) ×3 IMPLANT
SUT VIC AB 3-0 PS2 18 (SUTURE)
SUT VIC AB 3-0 PS2 18XBRD (SUTURE) IMPLANT
SUT VICRYL 0 UR6 27IN ABS (SUTURE) ×3 IMPLANT
TOWEL OR 17X24 6PK STRL BLUE (TOWEL DISPOSABLE) ×6 IMPLANT
TROCAR XCEL NON-BLD 11X100MML (ENDOMECHANICALS) ×3 IMPLANT
TROCAR XCEL NON-BLD 5MMX100MML (ENDOMECHANICALS) IMPLANT
WARMER LAPAROSCOPE (MISCELLANEOUS) ×3 IMPLANT
WATER STERILE IRR 1000ML POUR (IV SOLUTION) ×3 IMPLANT

## 2013-11-15 NOTE — Discharge Instructions (Signed)
DISCHARGE INSTRUCTIONS: Laparoscopy  MAY TAKE IBUPROFEN (MOTRIN, ADVIL) OR ALEVE AFTER 2:00 PM FOR CRAMPS!!!  The following instructions have been prepared to help you care for yourself upon your return home today.  Wound care:  Do not get the incision wet for the first 24 hours. The incision should be kept clean and dry.  The Band-Aids or dressings may be removed the day after surgery.  Should the incision become sore, red, and swollen after the first week, check with your doctor.  Personal hygiene:  Shower the day after your procedure.  Activity and limitations:  Do NOT drive or operate any equipment today.  Do NOT lift anything more than 15 pounds for 2-3 weeks after surgery.  Do NOT rest in bed all day.  Walking is encouraged. Walk each day, starting slowly with 5-minute walks 3 or 4 times a day. Slowly increase the length of your walks.  Walk up and down stairs slowly.  Do NOT do strenuous activities, such as golfing, playing tennis, bowling, running, biking, weight lifting, gardening, mowing, or vacuuming for 2-4 weeks. Ask your doctor when it is okay to start.  Diet: Eat a light meal as desired this evening. You may resume your usual diet tomorrow.  Return to work: This is dependent on the type of work you do. For the most part you can return to a desk job within a week of surgery. If you are more active at work, please discuss this with your doctor.  What to expect after your surgery: You may have a slight burning sensation when you urinate on the first day. You may have a very small amount of blood in the urine. Expect to have a small amount of vaginal discharge/light bleeding for 1-2 weeks. It is not unusual to have abdominal soreness and bruising for up to 2 weeks. You may be tired and need more rest for about 1 week. You may experience shoulder pain for 24-72 hours. Lying flat in bed may relieve it.  Call your doctor for any of the following:  Develop a fever of  100.4 or greater  Inability to urinate 6 hours after discharge from hospital  Severe pain not relieved by pain medications  Persistent of heavy bleeding at incision site  Redness or swelling around incision site after a week  Increasing nausea or vomiting  Patient Signature________________________________________ Nurse Signature_________________________________________

## 2013-11-15 NOTE — Transfer of Care (Signed)
Immediate Anesthesia Transfer of Care Note  Patient: Tammy Boyd  Procedure(s) Performed: Procedure(s): LAPAROSCOPIC TUBAL LIGATION (Bilateral)  Patient Location: PACU  Anesthesia Type:General  Level of Consciousness: awake, alert  and oriented  Airway & Oxygen Therapy: Patient Spontanous Breathing and Patient connected to nasal cannula oxygen  Post-op Assessment: Report given to PACU RN, Post -op Vital signs reviewed and stable and Patient moving all extremities  Post vital signs: Reviewed and stable  Complications: No apparent anesthesia complications

## 2013-11-15 NOTE — Op Note (Signed)
Operative Report  Preoperative Diagnosis Desires sterility Failed Essure  Postoperative diagnosis Same  Procedure Laparoscopic bilateral tubal fulguration  Surgeon Dr. Paula Compton  Anesthesia Gen.  Fluids Estimated blood loss less than 50 cc Urine output 50 cc straight catheter prior to procedure IV fluids 800 cc LR  Findings The uterus was normal in size but did have a serosal fibroid on the superior fundus measuring approximately 2-3 cm. The left fallopian tube appeared normal as well as the left ovary. The right fallopian tube appeared normal but was somewhat retroflexed upon itself at the uterine cornua. The right ovary was within normal limits. The liver edge and the remainder of the pelvis appeared normal  No specimens were sent  Procedure Patient was taken to the operating room where general anesthesia was obtained without difficulty. She was then prepped and draped in the normal sterile fashion in the dorsal lithotomy position. An appropriate timeout was performed. A speculum was then placed within the vagina and a Hulka tenaculum placed within the cervix for uterine manipulation. The bladder was emptied. Attention was then turned to the patient's abdomen after draping where the infraumbilical area was injected with approximately 10 cc of quarter percent Marcaine.  A  1 cm incision was then made within the umbilicus and the varies needle easily introduced into the peritoneal cavity. Intraperitoneal placement was confirmed by aspiration and injection with normal saline. Gas flow was then applied and a pneumoperitoneum obtained with approximate 3 L of CO2 gas. The varies needle was then removed and the 11 mm trocar was easily introduced into the abdomen. With patient in Trendelenburg the uterus and tubes and ovaries were inspected with findings as previously stated. A bipolar cautery was then introduced through the operative scope and the left fallopian tube grasped  approximately 3 cm from the uterine cornua and cauterized in 2 sequential areas with good blanching noted. Attention was then turned to the right fallopian tube which was likewise grasped proximally 3 cm from the uterine cornua and cauterized into sequential spots with good blanching noted there as well. The remainder of the pelvis and abdomen were inspected and found to be normal. The instruments were removed from the abdomen and the pneumoperitoneum reduced through the trocar. The trocar was finally removed and the infraumbilical incision closed with one deep stitch of 0 Vicryl and a subcuticular stitch of 3-0 Vicryl. Dermabond and a bandage were placed. Patient was then awakened and taken to the recovery room in good condition.

## 2013-11-15 NOTE — Addendum Note (Signed)
Addendum  created 11/15/13 0920 by Billie Lade, CRNA   Modules edited: Anesthesia LDA, Lines/Drains/Airways Properties Editor   Lines/Drains/Airways Properties Editor:  Properties of line/drain/airway/wound [REMOVED] Airway 7 mm have been modified.

## 2013-11-15 NOTE — Anesthesia Postprocedure Evaluation (Signed)
  Anesthesia Post Note  Patient: Tammy Boyd  Procedure(s) Performed: Procedure(s) (LRB): LAPAROSCOPIC TUBAL LIGATION (Bilateral)  Anesthesia type: GA  Patient location: PACU  Post pain: Pain level controlled  Post assessment: Post-op Vital signs reviewed  Last Vitals:  Filed Vitals:   11/15/13 0815  BP: 130/81  Pulse: 89  Temp:   Resp: 22    Post vital signs: Reviewed  Level of consciousness: sedated  Complications: No apparent anesthesia complications

## 2013-11-15 NOTE — Progress Notes (Signed)
Patient ID: Tammy Boyd, female   DOB: 10/23/1975, 38 y.o.   MRN: [id number] Pt doing well.  No changes in dictated H&P.  Brief exam WNL.  Ready to proceed.  D/w pt procedure in detail.

## 2013-11-16 ENCOUNTER — Encounter (HOSPITAL_COMMUNITY): Payer: Self-pay | Admitting: Obstetrics and Gynecology

## 2014-07-25 ENCOUNTER — Telehealth: Payer: Self-pay | Admitting: Family

## 2014-07-25 ENCOUNTER — Encounter: Payer: Self-pay | Admitting: Family

## 2014-07-25 ENCOUNTER — Other Ambulatory Visit (INDEPENDENT_AMBULATORY_CARE_PROVIDER_SITE_OTHER): Payer: 59

## 2014-07-25 ENCOUNTER — Ambulatory Visit (INDEPENDENT_AMBULATORY_CARE_PROVIDER_SITE_OTHER): Payer: 59 | Admitting: Family

## 2014-07-25 VITALS — BP 110/80 | HR 79 | Temp 98.3°F | Resp 18 | Ht 68.0 in | Wt 242.0 lb

## 2014-07-25 DIAGNOSIS — R600 Localized edema: Secondary | ICD-10-CM

## 2014-07-25 LAB — COMPREHENSIVE METABOLIC PANEL
ALBUMIN: 4 g/dL (ref 3.5–5.2)
ALK PHOS: 64 U/L (ref 39–117)
ALT: 13 U/L (ref 0–35)
AST: 15 U/L (ref 0–37)
BUN: 8 mg/dL (ref 6–23)
CHLORIDE: 103 meq/L (ref 96–112)
CO2: 28 meq/L (ref 19–32)
Calcium: 9.4 mg/dL (ref 8.4–10.5)
Creatinine, Ser: 0.72 mg/dL (ref 0.40–1.20)
GFR: 115.1 mL/min (ref >60.00)
Glucose, Bld: 97 mg/dL (ref 70–99)
Potassium: 4.4 mEq/L (ref 3.5–5.1)
SODIUM: 138 meq/L (ref 135–145)
Total Bilirubin: 0.5 mg/dL (ref 0.2–1.2)
Total Protein: 7.5 g/dL (ref 6.0–8.3)

## 2014-07-25 LAB — MAGNESIUM: Magnesium: 1.6 mg/dL (ref 1.5–2.5)

## 2014-07-25 NOTE — Telephone Encounter (Signed)
Please inform patient that her kidney function, liver function, electrolytes and magnesium are all within the normal limits. Therefore please continue with the plan we discussed to work on her lower extremity swelling.

## 2014-07-25 NOTE — Patient Instructions (Signed)
Thank you for choosing [facility].  Summary/Instructions:  Please stop by the lab on the basement level of the building for your blood work. Your results will be released to [place] (or called to you) after review, usually within 72 hours after test completion. If any changes need to be made, you will be notified at that same time.  If your symptoms worsen or fail to improve, please contact our office for further instruction, or in case of emergency go directly to the emergency room at the closest medical facility.   Please elevate your extremities while seated.   Decrease salt in your diet.  Compression as needed.   Edema Edema is an abnormal buildup of fluids in your bodytissues. Edema is somewhatdependent on gravity to pull the fluid to the lowest place in your body. That makes the condition more common in the legs and thighs (lower extremities). Painless swelling of the feet and ankles is common and becomes more likely as you get older. It is also common in looser tissues, like around your eyes.  When the affected area is squeezed, the fluid may move out of that spot and leave a dent for a few moments. This dent is called pitting.  CAUSES  There are many possible causes of edema. Eating too much salt and being on your feet or sitting for a long time can cause edema in your legs and ankles. Hot weather may make edema worse. Common medical causes of edema include:  Heart failure.  Liver disease.  Kidney disease.  Weak blood vessels in your legs.  Cancer.  An injury.  Pregnancy.  Some medications.  Obesity. SYMPTOMS  Edema is usually painless.Your skin may look swollen or shiny.  DIAGNOSIS  Your health care provider may be able to diagnose edema by asking about your medical history and doing a physical exam. You may need to have tests such as X-rays, an electrocardiogram, or blood tests to check for medical conditions that may cause edema.  TREATMENT  Edema  treatment depends on the cause. If you have heart, liver, or kidney disease, you need the treatment appropriate for these conditions. General treatment may include:  Elevation of the affected body part above the level of your heart.  Compression of the affected body part. Pressure from elastic bandages or support stockings squeezes the tissues and forces fluid back into the blood vessels. This keeps fluid from entering the tissues.  Restriction of fluid and salt intake.  Use of a water pill (diuretic). These medications are appropriate only for some types of edema. They pull fluid out of your body and make you urinate more often. This gets rid of fluid and reduces swelling, but diuretics can have side effects. Only use diuretics as directed by your health care provider. HOME CARE INSTRUCTIONS   Keep the affected body part above the level of your heart when you are lying down.   Do not sit still or stand for prolonged periods.   Do not put anything directly under your knees when lying down.  Do not wear constricting clothing or garters on your upper legs.   Exercise your legs to work the fluid back into your blood vessels. This may help the swelling go down.   Wear elastic bandages or support stockings to reduce ankle swelling as directed by your health care provider.   Eat a low-salt diet to reduce fluid if your health care provider recommends it.   Only take medicines as directed by your health  care provider. SEEK MEDICAL CARE IF:   Your edema is not responding to treatment.  You have heart, liver, or kidney disease and notice symptoms of edema.  You have edema in your legs that does not improve after elevating them.   You have sudden and unexplained weight gain. SEEK IMMEDIATE MEDICAL CARE IF:   You develop shortness of breath or chest pain.   You cannot breathe when you lie down.  You develop pain, redness, or warmth in the swollen areas.   You have heart,  liver, or kidney disease and suddenly get edema.  You have a fever and your symptoms suddenly get worse. MAKE SURE YOU:   Understand these instructions.  Will watch your condition.  Will get help right away if you are not doing well or get worse. Document Released: 12/24/2004 Document Revised: 05/10/2013 Document Reviewed: 10/16/2012 [facility] Patient Information 2015 [place], [place]. This information is not intended to replace advice given to you by your health care provider. Make sure you discuss any questions you have with your health care provider.

## 2014-07-25 NOTE — Assessment & Plan Note (Signed)
Symptoms of lower extremity edema of undetermined origin. Obtain complete metabolic panel to check kidney, liver, and electrolyte function. Continue magnesium. Treat conservatively at this time through elevation, compression, and decrease salt in her diet. May benefit from a light dose of hydrochlorothiazide if symptoms worsen.

## 2014-07-25 NOTE — Progress Notes (Signed)
Subjective:    Patient ID: Tammy Boyd, female    DOB: 1975-11-19, 39 y.o.   MRN: [id number]  Chief Complaint  Patient presents with  . Establish Care    has recently started to develope excessive swelling in both of her legs, she stopped smoking 2 years ago and has gained weight since which she thinks might be contributing as well as hereditary, both parents had the same issue, didn't know if there was something else that she should be doing     HPI:  Tammy Boyd is a 39 y.o. female with a PMH of seasonal allergies, hemorrhoids, and colon polyps who presents today for an office visit to establish care.   1.) Lower Extremity Edema - Associated symptom of edema located in her bilateral lower extremity that has been going on for a couple of years. Modifying factors include dietary supplements and increasing physical activity which has helped a little. Timing of the symptoms is worse in the evening and night and improved in the morning. Severity of the symptoms at worst result in mild pain if swelling is bad.   No Known Allergies   Outpatient Prescriptions Prior to Visit  Medication Sig Dispense Refill  . cetirizine (ZYRTEC) 10 MG tablet Take 10 mg by mouth daily.    . fluticasone (FLONASE) 50 MCG/ACT nasal spray Place 1 spray into both nostrils daily.    Marland Kitchen ibuprofen (MOTRIN IB) 200 MG tablet Take 3 tablets (600 mg total) by mouth every 6 (six) hours as needed for moderate pain. 30 tablet 0  . oxyCODONE-acetaminophen (ROXICET) 5-325 MG per tablet Take 1 tablet by mouth every 4 (four) hours as needed for severe pain. 15 tablet 0   No facility-administered medications prior to visit.     Past Medical History  Diagnosis Date  . Medical history non-contributory   . Hemorrhoid   . Chicken pox   . Allergy   . Colon polyps      Past Surgical History  Procedure Laterality Date  . Induced abortion    . Laparoscopic tubal ligation Bilateral 11/15/2013    Procedure:  LAPAROSCOPIC TUBAL LIGATION;  Surgeon: Logan Bores, MD;  Location: [facility];  Service: Gynecology;  Laterality: Bilateral;     Family History  Problem Relation Age of Onset  . Breast cancer Maternal Grandmother     maternal great aunt  . Diabetes Maternal Grandmother   . Colon cancer Father     in remission  . Hypertension Father   . Colon polyps Mother   . Ulcerative colitis Mother   . Diabetes Mother     father, maternal aunts  . Hyperlipidemia Mother   . Hypertension Mother   . Mental illness Mother   . Liver cancer Paternal Grandmother      History   Social History  . Marital Status: Divorced    Spouse Name: N/A  . Number of Children: 2  . 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 Smoking status: Former Smoker -- 0.10 packs/day for 12 years    Types: Cigarettes    Quit date: 11/22/2011  . Smokeless tobacco: Never Used  . Alcohol Use: Yes     Comment: socially  . Drug Use: No  . Sexual Activity: Yes    Birth Control/ Protection: None, Surgical   Other Topics Concern  . Not on file   Social History Narrative  Fun: [employer] out with the children and extracurricular activity;    Denies religious beliefs effecting health care.    Currently feels safe where she lives and denies abuse     Review of Systems  Constitutional: Negative for fever and chills.  Respiratory: Negative for chest tightness and shortness of breath.   Cardiovascular: Positive for leg swelling. Negative for chest pain and palpitations.      Objective:    BP 110/80 mmHg  Pulse 79  Temp(Src) 98.3 F (36.8 C) (Oral)  Resp 18  Ht 5\' 8"  (1.727 m)  Wt 242 lb (109.77 kg)  BMI 36.80 kg/m2  SpO2 95% Nursing note and vital signs reviewed.  Physical Exam  Constitutional: She is oriented to person, place, and time. She appears well-developed and well-nourished. No distress.  Cardiovascular: Normal rate, regular rhythm,  normal heart sounds and intact distal pulses.   Mild to moderate nonpitting edema located in bilateral lower extremities. Distal pulses and sensation are intact. No evidence of skin discoloration or calf tenderness noted.  Pulmonary/Chest: Effort normal and breath sounds normal.  Neurological: She is alert and oriented to person, place, and time.  Skin: Skin is warm and dry.  Psychiatric: She has a normal mood and affect. Her behavior is normal. Judgment and thought content normal.       Assessment & Plan:   Problem List Items Addressed This Visit      Other   Lower extremity edema - Primary    Symptoms of lower extremity edema of undetermined origin. Obtain complete metabolic panel to check kidney, liver, and electrolyte function. Continue magnesium. Treat conservatively at this time through elevation, compression, and decrease salt in her diet. May benefit from a light dose of hydrochlorothiazide if symptoms worsen.      Relevant Orders   Comprehensive metabolic panel (Completed)   Magnesium (Completed)

## 2014-07-25 NOTE — Progress Notes (Signed)
Pre visit review using our clinic review tool, if applicable. No additional management support is needed unless otherwise documented below in the visit note. 

## 2014-07-26 NOTE — Telephone Encounter (Signed)
Pt aware of results 

## 2015-01-18 ENCOUNTER — Telehealth: Payer: Self-pay | Admitting: Internal Medicine

## 2015-01-18 NOTE — Telephone Encounter (Signed)
Pt states she has had hemorrhoids and been given some suppositories by her GYN but they have not helped. She has tried OTC meds, creams, and sitz bathes but still having pain and swelling. Pt requesting to be seen. Pt scheduled to see Cecille Rubin Hvozdovic, PA-C tomorrow at 2:45pm. Pt aware of appt.

## 2015-01-19 ENCOUNTER — Encounter: Payer: Self-pay | Admitting: Physician Assistant

## 2015-01-19 ENCOUNTER — Ambulatory Visit (INDEPENDENT_AMBULATORY_CARE_PROVIDER_SITE_OTHER): Payer: 59 | Admitting: Physician Assistant

## 2015-01-19 VITALS — BP 118/86 | HR 72 | Ht 66.25 in | Wt 242.5 lb

## 2015-01-19 DIAGNOSIS — K602 Anal fissure, unspecified: Secondary | ICD-10-CM

## 2015-01-19 DIAGNOSIS — K648 Other hemorrhoids: Secondary | ICD-10-CM

## 2015-01-19 DIAGNOSIS — K644 Residual hemorrhoidal skin tags: Secondary | ICD-10-CM

## 2015-01-19 MED ORDER — DILTIAZEM GEL 2 %: 1.0000 "application " | Freq: Two times a day (BID) | CUTANEOUS | Status: DC

## 2015-01-19 NOTE — Patient Instructions (Addendum)
We have sent the following medications to your pharmacy for you to pick up at your convenience:  Diltiazem gel 2% ointment rectally twice a day to three times a day x 6 weeks. This has been sent to [facility].   Follow up as needed.

## 2015-01-19 NOTE — Progress Notes (Signed)
Patient ID: Tammy Boyd, female   DOB: 1975-09-10, 40 y.o.   MRN: [id number]     History of Present Illness: Tammy Boyd  Is a pleasant 40 year old female who is known to Dr. Henrene Pastor from prior colonoscopy. She had a screening colonoscopy in January 2014 and a diminutive polyp was found in the ascending colon. She was advised to have a repeat colonoscopy in 5 years. She is here today with a 2 month history of rectal pain and bleeding she states she had an ear infection and was given an antibiotic. She then developed diarrhea and after having diarrhea for several days began to have rectal pain and bleeding. Over the past week her pain has gotten worse and she feels as if she is passing razor blades and has burning. She has tried Tucks and sitz baths with little relief. She was prescribed a suppository by her PCP but states it is too painful to get them in. She has blood on the toilet tissue, blood striping on the stool, and blood dripping in the commode. She has had no fever, chills, or night sweats.   Past Medical History  Diagnosis Date  . Medical history non-contributory   . Hemorrhoid   . Chicken pox   . Allergy   . Colon polyps     Past Surgical History  Procedure Laterality Date  . Induced abortion    . Laparoscopic tubal ligation Bilateral 11/15/2013    Procedure: LAPAROSCOPIC TUBAL LIGATION;  Surgeon: Logan Bores, MD;  Location: [facility];  Service: Gynecology;  Laterality: Bilateral;   Family History  Problem Relation Age of Onset  . Breast cancer Maternal Grandmother     maternal great aunt  . Diabetes Maternal Grandmother   . Colon cancer Father     in remission  . Hypertension Father   . Colon polyps Mother   . Ulcerative colitis Mother   . Diabetes Mother     father, maternal aunts  . Hyperlipidemia Mother   . Hypertension Mother   . Mental illness Mother   . Liver cancer Paternal Grandmother    Social History  Substance Use Topics  . Smoking status:  Former Smoker -- 0.10 packs/day for 12 years    Types: Cigarettes    Quit date: 11/22/2011  . Smokeless tobacco: Never Used  . Alcohol Use: Yes     Comment: socially   Current Outpatient Prescriptions  Medication Sig Dispense Refill  . hydrocortisone (ANUSOL-HC) 2.5 % rectal cream Place 1 application rectally 2 (two) times daily.    . Multiple Vitamin (MULTIVITAMIN) tablet Take 1 tablet by mouth daily.    Marland Kitchen diltiazem 2 % GEL Apply 1 application topically 2 (two) times daily. 1 Package 1   No current facility-administered medications for this visit.   No Known Allergies   Review of Systems: Gen: Denies any fever, chills, sweats, anorexia, fatigue, weakness, malaise, weight loss, and sleep disorder CV: Denies chest pain, angina, palpitations, syncope, orthopnea, PND, peripheral edema, and claudication. Resp: Denies dyspnea at rest, dyspnea with exercise, cough, sputum, wheezing, coughing up blood, and pleurisy. GI: Denies vomiting blood, jaundice, and fecal incontinence.   Denies dysphagia or odynophagia. GU : Denies urinary burning, blood in urine, urinary frequency, urinary hesitancy, nocturnal urination, and urinary incontinence. MS: Denies joint pain, limitation of movement, and swelling, stiffness, low back pain, extremity pain. Denies muscle weakness, cramps, atrophy.  Derm: Denies rash, itching, dry skin, hives, moles, warts, or unhealing ulcers.  Psych: Denies depression,  anxiety, memory loss, suicidal ideation, hallucinations, paranoia, and confusion. Heme: Denies bruising, bleeding, and enlarged lymph nodes. Neuro:  Denies any headaches, dizziness, paresthesia Endo:  Denies any problems with DM, thyroid, adrenal    Physical Exam: BP 118/86 mmHg  Pulse 72  Ht 5' 6.25" (1.683 m)  Wt 242 lb 8 oz (109.997 kg)  BMI 38.83 kg/m2 General: Pleasant, well developed ,  African-Americanfemale in no acute distress Head: Normocephalic and atraumatic Eyes:  sclerae anicteric,  conjunctiva pink  Ears: Normal auditory acuity Lungs: Clear throughout to auscultation Heart: Regular rate and rhythm Abdomen: Soft, non distended, non-tender. No masses, no hepatomegaly. Normal bowel sounds Rectal:  Several small external hemorrhoids and skin tags. DRE with a posterior palpable ridge area anoscopy reveals a small posterior anal fissure and internal hemorrhoids. There is no palpable swelling and no obvious erythema or induration suggestive of an abscess. Musculoskeletal: Symmetrical with no gross deformities  Extremities: No edema  Neurological: Alert oriented x 4, grossly nonfocal Psychological:  Alert and cooperative. Normal mood and affect  Assessment and Recommendations:   40 year old female with a 2 month history of rectal pain and bleeding here for evaluation. Examination reveals internal, external hemorrhoids as well as an anal fissure. Patient has been advised to keep her stools soft. She will continue Tucks wipes and sitz baths. She will be given a trial of diltiazem ointment 2% to apply rectally 2-3 times daily for 6 weeks. She was advised to call us in a week or 2 if she is not improving. She was instructed to call us immediately if she experiences swelling, fever, or chills.       Tammy Boyd, Tammy Boyd 01/19/2015,

## 2015-01-19 NOTE — Progress Notes (Signed)
Agree with initial assessment and plans 

## 2017-02-12 ENCOUNTER — Encounter: Payer: Self-pay | Admitting: Internal Medicine

## 2017-03-12 ENCOUNTER — Encounter: Payer: Self-pay | Admitting: *Deleted

## 2017-03-28 ENCOUNTER — Other Ambulatory Visit: Payer: Self-pay

## 2017-03-28 ENCOUNTER — Ambulatory Visit (AMBULATORY_SURGERY_CENTER): Payer: Self-pay | Admitting: *Deleted

## 2017-03-28 VITALS — Ht 67.5 in | Wt 256.0 lb

## 2017-03-28 DIAGNOSIS — Z86010 Personal history of colonic polyps: Secondary | ICD-10-CM

## 2017-03-28 DIAGNOSIS — Z800 Family history of malignant neoplasm of digestive organs: Secondary | ICD-10-CM

## 2017-03-28 MED ORDER — NA SULFATE-K SULFATE-MG SULF 17.5-3.13-1.6 GM/177ML PO SOLN: 1.0000 | Freq: Once | ORAL | 0 refills | Status: AC

## 2017-03-28 NOTE — Progress Notes (Signed)
No egg or soy allergy known to patient  No issues with past sedation with any surgeries  or procedures, no intubation problems  No diet pills per patient No home 02 use per patient  No blood thinners per patient  Pt denies issues with constipation  No A fib or A flutter  EMMI video sent to pt's e mail - pt declined  $15 coupon for suprep to pt in [place] today

## 2017-04-04 ENCOUNTER — Encounter: Payer: Self-pay | Admitting: Internal Medicine

## 2017-04-11 ENCOUNTER — Ambulatory Visit (AMBULATORY_SURGERY_CENTER): Payer: 59 | Admitting: Internal Medicine

## 2017-04-11 ENCOUNTER — Encounter: Payer: Self-pay | Admitting: Internal Medicine

## 2017-04-11 ENCOUNTER — Other Ambulatory Visit: Payer: Self-pay

## 2017-04-11 VITALS — BP 93/69 | HR 68 | Temp 98.6°F | Resp 12 | Ht 67.0 in | Wt 256.0 lb

## 2017-04-11 DIAGNOSIS — Z86010 Personal history of colonic polyps: Secondary | ICD-10-CM

## 2017-04-11 DIAGNOSIS — Z800 Family history of malignant neoplasm of digestive organs: Secondary | ICD-10-CM

## 2017-04-11 MED ORDER — SODIUM CHLORIDE 0.9 % IV SOLN: 500.0000 mL | INTRAVENOUS | Status: DC

## 2017-04-11 NOTE — Progress Notes (Signed)
To recovery, report to RN, VSS. 

## 2017-04-11 NOTE — Progress Notes (Signed)
Pt's states no medical or surgical changes since previsit or office visit. 

## 2017-04-11 NOTE — Patient Instructions (Signed)
Normal Colonoscopy! Repeat in 5 years :)   YOU HAD AN ENDOSCOPIC PROCEDURE TODAY AT THE [facility]:   Refer to the procedure report that was given to you for any specific questions about what was found during the examination.  If the procedure report does not answer your questions, please call your gastroenterologist to clarify.  If you requested that your care partner not be given the details of your procedure findings, then the procedure report has been included in a sealed envelope for you to review at your convenience later.  YOU SHOULD EXPECT: Some feelings of bloating in the abdomen. Passage of more gas than usual.  Walking can help get rid of the air that was put into your GI tract during the procedure and reduce the bloating. If you had a lower endoscopy (such as a colonoscopy or flexible sigmoidoscopy) you may notice spotting of blood in your stool or on the toilet paper. If you underwent a bowel prep for your procedure, you may not have a normal bowel movement for a few days.  Please Note:  You might notice some irritation and congestion in your nose or some drainage.  This is from the oxygen used during your procedure.  There is no need for concern and it should clear up in a day or so.  SYMPTOMS TO REPORT IMMEDIATELY:   Following lower endoscopy (colonoscopy or flexible sigmoidoscopy):  Excessive amounts of blood in the stool  Significant tenderness or worsening of abdominal pains  Swelling of the abdomen that is new, acute  Fever of 100F or higher  For urgent or emergent issues, a gastroenterologist can be reached at any hour by calling [phone].   DIET:  We do recommend a small meal at first, but then you may proceed to your regular diet.  Drink plenty of fluids but you should avoid alcoholic beverages for 24 hours.  ACTIVITY:  You should plan to take it easy for the rest of today and you should NOT DRIVE or use heavy machinery until tomorrow (because of the  sedation medicines used during the test).    FOLLOW UP: Our staff will call the number listed on your records the next business day following your procedure to check on you and address any questions or concerns that you may have regarding the information given to you following your procedure. If we do not reach you, we will leave a message.  However, if you are feeling well and you are not experiencing any problems, there is no need to return our call.  We will assume that you have returned to your regular daily activities without incident.  If any biopsies were taken you will be contacted by phone or by letter within the next 1-3 weeks.  Please call us at [phone] if you have not heard about the biopsies in 3 weeks.    SIGNATURES/CONFIDENTIALITY: You and/or your care partner have signed paperwork which will be entered into your electronic medical record.  These signatures attest to the fact that that the information above on your After Visit Summary has been reviewed and is understood.  Full responsibility of the confidentiality of this discharge information lies with you and/or your care-partner. 

## 2017-04-11 NOTE — Op Note (Signed)
[facility] Patient Name: Tammy Boyd Procedure Date: 04/11/2017 7:53 AM MRN: [id number] Endoscopist: Docia Chuck. Henrene Pastor , MD Age: 42 Referring MD:  Date of Birth: 1975-02-08 Gender: Female Account #: [id number] Procedure:                Colonoscopy Indications:              High risk colon cancer surveillance: Personal                            history of non-advanced adenoma. Index exam January                            2014. Family history of colon cancer in                            first-degree relative less than 66 years of age Medicines:                Monitored Anesthesia Care Procedure:                Pre-Anesthesia Assessment:                           - Prior to the procedure, a History and Physical                            was performed, and patient medications and                            allergies were reviewed. The patient's tolerance of                            previous anesthesia was also reviewed. The risks                            and benefits of the procedure and the sedation                            options and risks were discussed with the patient.                            All questions were answered, and informed consent                            was obtained. Prior Anticoagulants: The patient has                            taken no previous anticoagulant or antiplatelet                            agents. ASA Grade Assessment: I - A normal, healthy                            patient. After reviewing the risks and benefits,  the patient was deemed in satisfactory condition to                            undergo the procedure.                           After obtaining informed consent, the colonoscope                            was passed under direct vision. Throughout the                            procedure, the patient's blood pressure, pulse, and                            oxygen saturations were monitored  continuously. The                            Colonoscope was introduced through the anus and                            advanced to the the cecum, identified by                            appendiceal orifice and ileocecal valve. The                            ileocecal valve, appendiceal orifice, and rectum                            were photographed. The quality of the bowel                            preparation was excellent. The colonoscopy was                            performed without difficulty. The patient tolerated                            the procedure well. The bowel preparation used was                            SUPREP. Scope In: 8:19:42 AM Scope Out: 8:31:13 AM Scope Withdrawal Time: 0 hours 8 minutes 52 seconds  Total Procedure Duration: 0 hours 11 minutes 31 seconds  Findings:                 The entire examined colon appeared normal on direct                            and retroflexion views. Complications:            No immediate complications. Estimated blood loss:                            None. Estimated Blood  Loss:     Estimated blood loss: none. Impression:               - The entire examined colon is normal on direct and                            retroflexion views.                           - No specimens collected. Recommendation:           - Repeat colonoscopy in 5 years for                            surveillance(family history).                           - Patient has a contact number available for                            emergencies. The signs and symptoms of potential                            delayed complications were discussed with the                            patient. Return to normal activities tomorrow.                            Written discharge instructions were provided to the                            patient.                           - Resume previous diet.                           - Continue present medications. Docia Chuck.  Henrene Pastor, MD 04/11/2017 8:38:50 AM This report has been signed electronically.

## 2017-04-14 ENCOUNTER — Telehealth: Payer: Self-pay

## 2017-04-14 NOTE — Telephone Encounter (Signed)
  Follow up Call-  Call back number 04/11/2017  Post procedure Call Back phone  # [phone]  Permission to leave phone message Yes  Some recent data might be hidden     Patient questions:  Do you have a fever, pain , or abdominal swelling? No. Pain Score  0 *  Have you tolerated food without any problems? Yes.    Have you been able to return to your normal activities? Yes.    Do you have any questions about your discharge instructions: Diet   No. Medications  No. Follow up visit  No.  Do you have questions or concerns about your Care? No.  Actions: * If pain score is 4 or above: No action needed, pain <4.

## 2018-05-17 ENCOUNTER — Telehealth: Payer: 59 | Admitting: Family

## 2018-05-17 DIAGNOSIS — J069 Acute upper respiratory infection, unspecified: Secondary | ICD-10-CM

## 2018-05-17 MED ORDER — ALBUTEROL SULFATE HFA 108 (90 BASE) MCG/ACT IN AERS: 2.0000 | INHALATION_SPRAY | Freq: Four times a day (QID) | RESPIRATORY_TRACT | 0 refills | Status: DC | PRN

## 2018-05-17 MED ORDER — BENZONATATE 100 MG PO CAPS: 100.0000 mg | ORAL_CAPSULE | Freq: Three times a day (TID) | ORAL | 0 refills | Status: DC | PRN

## 2018-05-17 NOTE — Progress Notes (Signed)
Greater than 5 minutes, yet less than 10 minutes of time have been spent researching, coordinating, and implementing care for this patient today.  Thank you for the details you included in the comment boxes. Those details are very helpful in determining the best course of treatment for you and help [place] to provide the best care.  E-Visit for Corona Virus Screening  Based on your current symptoms, it seems unlikely that your symptoms are related to the Coronavirus.   You have been enrolled in [facility] for COVID-19. Daily you will receive a questionnaire within the [facility] website. Our COVID-19 response team will be monitoring your responses daily.   Coronavirus disease 2019 (COVID-19) is a respiratory illness that can spread from person to person. The virus that causes COVID-19 is a new virus that was first identified in the country of [place] but is now found in multiple other countries and has spread to the [place].  Symptoms associated with the virus are mild to severe fever, cough, and shortness of breath. There is currently no vaccine to protect against COVID-19, and there is no specific antiviral treatment for the virus.   To be considered HIGH RISK for Coronavirus (COVID-19), you have to meet the following criteria:  . Traveled to [place], [place], [place], [place] or [place]; or in the [place] to [place], [place], [place], or [place]; and have fever, cough, and shortness of breath within the last 2 weeks of travel OR  . Been in close contact with a person diagnosed with COVID-19 within the last 2 weeks and have fever, cough, and shortness of breath  . IF YOU DO NOT MEET THESE CRITERIA, YOU ARE CONSIDERED LOW RISK FOR COVID-19.   It is vitally important that if you feel that you have an infection such as this virus or any other virus that you stay home and away from places where you may spread it to others.  You should self-quarantine for 14 days if you have  symptoms that could potentially be coronavirus and avoid contact with people age 7 and older.   You can use medication such as A prescription cough medication called Tessalon Perles 100 mg. You may take 1-2 capsules every 8 hours as needed for cough and A prescription inhaler called Albuterol MDI 90 mcg /actuation 2 puffs every 4 hours as needed for shortness of breath, wheezing, cough  You may also take acetaminophen (Tylenol) as needed for fever.   Reduce your risk of any infection by using the same precautions used for avoiding the common cold or flu:  Marland Kitchen Wash your hands often with soap and warm water for at least 20 seconds.  If soap and water are not readily available, use an alcohol-based hand sanitizer with at least 60% alcohol.  . If coughing or sneezing, cover your mouth and nose by coughing or sneezing into the elbow areas of your shirt or coat, into a tissue or into your sleeve (not your hands). . Avoid shaking hands with others and consider head nods or verbal greetings only. . Avoid touching your eyes, nose, or mouth with unwashed hands.  . Avoid close contact with people who are sick. . Avoid places or events with large numbers of people in one location, like concerts or sporting events. . Carefully consider travel plans you have or are making. . If you are planning any travel outside or inside the [place], visit the CDC's Travelers' Health webpage for the latest health notices. Marland Kitchen  If you have some symptoms but not all symptoms, continue to monitor at home and seek medical attention if your symptoms worsen. . If you are having a medical emergency, call 911.  HOME CARE . Only take medications as instructed by your medical team. . Drink plenty of fluids and get plenty of rest. . A steam or ultrasonic humidifier can help if you have congestion.   GET HELP RIGHT AWAY IF: . You develop worsening fever. . You become short of breath . You cough up blood. . Your symptoms become more  severe MAKE SURE YOU   Understand these instructions.  Will watch your condition.  Will get help right away if you are not doing well or get worse.  Your e-visit answers were reviewed by a board certified advanced clinical practitioner to complete your personal care plan.  Depending on the condition, your plan could have included both over the counter or prescription medications.  If there is a problem please reply once you have received a response from your provider. Your safety is important to [place].  If you have drug allergies check your prescription carefully.    You can use MyChart to ask questions about today's visit, request a non-urgent call back, or ask for a work or school excuse for 24 hours related to this e-Visit. If it has been greater than 24 hours you will need to follow up with your provider, or enter a new e-Visit to address those concerns. You will get an e-mail in the next two days asking about your experience.  I hope that your e-visit has been valuable and will speed your recovery. Thank you for using e-visits.

## 2022-02-28 ENCOUNTER — Encounter: Payer: Self-pay | Admitting: Internal Medicine

## 2022-04-01 ENCOUNTER — Ambulatory Visit (AMBULATORY_SURGERY_CENTER): Payer: Self-pay

## 2022-04-01 VITALS — Ht 66.0 in | Wt 276.0 lb

## 2022-04-01 DIAGNOSIS — Z800 Family history of malignant neoplasm of digestive organs: Secondary | ICD-10-CM

## 2022-04-01 DIAGNOSIS — Z86010 Personal history of colonic polyps: Secondary | ICD-10-CM

## 2022-04-01 MED ORDER — NA SULFATE-K SULFATE-MG SULF 17.5-3.13-1.6 GM/177ML PO SOLN: 1.0000 | Freq: Once | ORAL | 0 refills | Status: AC

## 2022-04-01 NOTE — Progress Notes (Signed)

## 2022-04-19 ENCOUNTER — Encounter: Payer: Self-pay | Admitting: Internal Medicine

## 2022-04-26 ENCOUNTER — Encounter: Payer: Self-pay | Admitting: Internal Medicine

## 2022-04-26 ENCOUNTER — Ambulatory Visit (AMBULATORY_SURGERY_CENTER): Payer: 59 | Admitting: Internal Medicine

## 2022-04-26 VITALS — BP 101/77 | HR 69 | Temp 97.3°F | Resp 18 | Ht 66.0 in | Wt 276.0 lb

## 2022-04-26 DIAGNOSIS — Z09 Encounter for follow-up examination after completed treatment for conditions other than malignant neoplasm: Secondary | ICD-10-CM

## 2022-04-26 DIAGNOSIS — Z86010 Personal history of colonic polyps: Secondary | ICD-10-CM | POA: Diagnosis not present

## 2022-04-26 DIAGNOSIS — Z800 Family history of malignant neoplasm of digestive organs: Secondary | ICD-10-CM

## 2022-04-26 MED ORDER — SODIUM CHLORIDE 0.9 % IV SOLN
500.0000 mL | Freq: Once | INTRAVENOUS | Status: DC
Start: 2022-04-26 — End: 2022-04-26

## 2022-04-26 NOTE — Op Note (Signed)
[facility] Patient Name: Tammy Boyd Procedure Date: 04/26/2022 8:45 AM MRN: [id number] Endoscopist: Wilhemina Bonito. Marina Goodell , MD, [id number] Age: 47 Referring MD:  Date of Birth: 1975-04-04 Gender: Female Account #: [id number] Procedure:                Colonoscopy Indications:              High risk colon cancer surveillance: Personal                            history of non-advanced adenoma. Also family                            history of colon cancer in first-degree relative                            less than age 71. Previous examinations 2014 and                            2019 Medicines:                Monitored Anesthesia Care Procedure:                Pre-Anesthesia Assessment:                           - Prior to the procedure, a History and Physical                            was performed, and patient medications and                            allergies were reviewed. The patient's tolerance of                            previous anesthesia was also reviewed. The risks                            and benefits of the procedure and the sedation                            options and risks were discussed with the patient.                            All questions were answered, and informed consent                            was obtained. Prior Anticoagulants: The patient has                            taken no anticoagulant or antiplatelet agents.                            After reviewing the risks and benefits, the patient  was deemed in satisfactory condition to undergo the                            procedure.                           After obtaining informed consent, the colonoscope                            was passed under direct vision. Throughout the                            procedure, the patient's blood pressure, pulse, and                            oxygen saturations were monitored continuously. The                             Olympus CF-HQ190L ([id number]) Colonoscope was                            introduced through the anus and advanced to the the                            cecum, identified by appendiceal orifice and                            ileocecal valve. The ileocecal valve, appendiceal                            orifice, and rectum were photographed. The quality                            of the bowel preparation was excellent. The                            colonoscopy was performed without difficulty. The                            patient tolerated the procedure well. The bowel                            preparation used was SUPREP via split dose                            instruction. Scope In: 9:04:34 AM Scope Out: 9:13:34 AM Scope Withdrawal Time: 0 hours 7 minutes 13 seconds  Total Procedure Duration: 0 hours 9 minutes 0 seconds  Findings:                 A few diverticula were found in the sigmoid colon.                           The exam was otherwise without abnormality on  direct and retroflexion views. Complications:            No immediate complications. Estimated blood loss:                            None. Estimated Blood Loss:     Estimated blood loss: none. Impression:               - Diverticulosis in the sigmoid colon.                           - The examination was otherwise normal on direct                            and retroflexion views.                           - No specimens collected. Recommendation:           - Repeat colonoscopy in 5 years for surveillance                            (family history).                           - Patient has a contact number available for                            emergencies. The signs and symptoms of potential                            delayed complications were discussed with the                            patient. Return to normal activities tomorrow.                            Written discharge instructions  were provided to the                            patient.                           - Resume previous diet.                           - Continue present medications. Wilhemina Bonito. Marina Goodell, MD 04/26/2022 9:24:35 AM This report has been signed electronically.

## 2022-04-26 NOTE — Progress Notes (Signed)
A and O x3. Report to RN. Tolerated MAC anesthesia well. 

## 2022-04-26 NOTE — Patient Instructions (Signed)
Handout provided on diverticulosis.   Resume previous diet.  Continue present medications.  Repeat colonoscopy in 5 years for surveillance (family history).   YOU HAD AN ENDOSCOPIC PROCEDURE TODAY AT THE [facility]:   Refer to the procedure report that was given to you for any specific questions about what was found during the examination.  If the procedure report does not answer your questions, please call your gastroenterologist to clarify.  If you requested that your care partner not be given the details of your procedure findings, then the procedure report has been included in a sealed envelope for you to review at your convenience later.  YOU SHOULD EXPECT: Some feelings of bloating in the abdomen. Passage of more gas than usual.  Walking can help get rid of the air that was put into your GI tract during the procedure and reduce the bloating. If you had a lower endoscopy (such as a colonoscopy or flexible sigmoidoscopy) you may notice spotting of blood in your stool or on the toilet paper. If you underwent a bowel prep for your procedure, you may not have a normal bowel movement for a few days.  Please Note:  You might notice some irritation and congestion in your nose or some drainage.  This is from the oxygen used during your procedure.  There is no need for concern and it should clear up in a day or so.  SYMPTOMS TO REPORT IMMEDIATELY:  Following lower endoscopy (colonoscopy or flexible sigmoidoscopy):  Excessive amounts of blood in the stool  Significant tenderness or worsening of abdominal pains  Swelling of the abdomen that is new, acute  Fever of 100F or higher  For urgent or emergent issues, a gastroenterologist can be reached at any hour by calling [phone]. Do not use MyChart messaging for urgent concerns.    DIET:  We do recommend a small meal at first, but then you may proceed to your regular diet.  Drink plenty of fluids but you should avoid alcoholic  beverages for 24 hours.  ACTIVITY:  You should plan to take it easy for the rest of today and you should NOT DRIVE or use heavy machinery until tomorrow (because of the sedation medicines used during the test).    FOLLOW UP: Our staff will call the number listed on your records the next business day following your procedure.  We will call around 7:15- 8:00 am to check on you and address any questions or concerns that you may have regarding the information given to you following your procedure. If we do not reach you, we will leave a message.     If any biopsies were taken you will be contacted by phone or by letter within the next 1-3 weeks.  Please call us at [phone] if you have not heard about the biopsies in 3 weeks.    SIGNATURES/CONFIDENTIALITY: You and/or your care partner have signed paperwork which will be entered into your electronic medical record.  These signatures attest to the fact that that the information above on your After Visit Summary has been reviewed and is understood.  Full responsibility of the confidentiality of this discharge information lies with you and/or your care-partner.

## 2022-04-26 NOTE — Progress Notes (Signed)
Pt's states no medical or surgical changes since previsit or office visit. 

## 2022-04-26 NOTE — Progress Notes (Signed)
HISTORY OF PRESENT ILLNESS:  Tammy Boyd is a 47 y.o. female with family history of colon cancer in first-degree relative less than 60.  And personal history of nonadvanced adenoma.  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 Her smoking use included cigarettes. She has a 1.20 pack-year smoking history. She has never used smokeless tobacco. She reports current alcohol use. She reports that she does not use drugs.  family history includes Breast cancer in her maternal grandmother; Colon cancer in her father; Colon polyps in her mother; Diabetes in her maternal grandmother and mother; Diverticulosis in her mother; Hyperlipidemia in her mother; Hypertension in her father and mother; Liver cancer in her paternal grandmother; Mental illness in her mother; Ulcerative colitis in her mother.  No Known Allergies     PHYSICAL EXAMINATION: Vital signs: BP 132/84   Pulse 64   Temp (!) 97.3 F (36.3 C)   Resp 14   Ht  (1.676 m)   Wt 276 lb (125.2 kg)   SpO2 100%   BMI 44.55 kg/m  General: Well-developed, well-nourished, no acute distress HEENT: Sclerae are anicteric, conjunctiva pink. Oral mucosa intact Lungs: Clear Heart: Regular Abdomen: soft, nontender, nondistended, no obvious ascites, no peritoneal signs, normal bowel sounds. No organomegaly. Extremities:  No edema Psychiatric: alert and oriented x3. Cooperative     ASSESSMENT:   Family history of colon cancer and personal history of adenomatous colon polyp.  PLAN:  Surveillance colonoscopy

## 2022-04-29 ENCOUNTER — Telehealth: Payer: Self-pay | Admitting: *Deleted

## 2022-04-29 NOTE — Telephone Encounter (Signed)
No answer on  follow up call. Left message.   

## 2023-05-20 ENCOUNTER — Ambulatory Visit: Admitting: Family Medicine

## 2023-05-20 ENCOUNTER — Other Ambulatory Visit: Payer: Self-pay

## 2023-05-20 VITALS — BP 160/88 | HR 74 | Ht 66.0 in | Wt 282.0 lb

## 2023-05-20 DIAGNOSIS — G8929 Other chronic pain: Secondary | ICD-10-CM

## 2023-05-20 DIAGNOSIS — M25571 Pain in right ankle and joints of right foot: Secondary | ICD-10-CM

## 2023-05-20 NOTE — Patient Instructions (Addendum)
 Thank you for coming in today.   Please work on the home exercises the athletic trainer went over with you:  View at [email or web address] code [email or web address]  Please use Voltaren gel (Generic Diclofenac Gel) up to 4x daily for pain as needed.  This is available over-the-counter as both the name brand Voltaren gel and the generic diclofenac gel.   Arch support at [employer]  CAM walker boot if needed  Check back in 1 month

## 2023-05-20 NOTE — Progress Notes (Unsigned)
   Tammy Muck, PhD, LAT, ATC acting as a scribe for Garlan Juniper, MD.  Tammy Boyd is a 48 y.o. female who presents to [facility] today for R ankle pain x 3-4 wks. No injury. Pt locates pain to the medial aspect of her R ankle, slight medial talocrural joint.   Swelling: yes- laterally Aggravates: walking, not wearing supportive shoes, walking barefoot at home.  Treatments tried: IBU, elevation  Pertinent review of systems: No fevers or chills  Relevant historical information: Lower extremity edema   Exam:  BP (!) 160/88   Pulse 74   Ht 5\' 6"  (1.676 m)   Wt 282 lb (127.9 kg)   SpO2 99%   BMI 45.52 kg/m  General: Well Developed, well nourished, and in no acute distress.   MSK: Right ankle mild edema.  Ankle pronation is present with standing. Mildly tender palpation at medial ankle along the course of the posterior tibialis tendon. Intact strength. Stable ligamentous exam.     Lab and Radiology Results  Diagnostic Limited MSK Ultrasound of: Right ankle Posterior tibialis tendon is intact without visible tear. Slight hypoechoic change around the tendon within the tendon sheath consistent with mild tenosynovitis. Impression: Mild posterior tibialis tenosynovitis      Assessment and Plan: 48 y.o. female with right ankle pain.  This is a chronic ongoing issue due primarily to the ankle pronation which has resulted in some posterior tibialis tenosynovitis.  She is good candidate for arch support and home exercise program.  Consider formal physical therapy if needed. Recheck back in about a month.  PDMP not reviewed this encounter. Orders Placed This Encounter  Procedures   US  LIMITED JOINT SPACE STRUCTURES LOW RIGHT(NO LINKED CHARGES)    Reason for Exam (SYMPTOM  OR DIAGNOSIS REQUIRED):   right ankle pain    Preferred imaging location?:   [facility]   No orders of the defined types were placed in  this encounter.    Discussed warning signs or symptoms. Please see discharge instructions. Patient expresses understanding.   The above documentation has been reviewed and is accurate and complete Garlan Juniper, M.D.

## 2023-06-23 ENCOUNTER — Ambulatory Visit: Admitting: Family Medicine
